# Patient Record
Sex: Male | Born: 1985 | Marital: Single | Smoking: Current every day smoker
Health system: Southern US, Community
[De-identification: ages and names within clinical notes are randomized; demographics above are authoritative.]

## PROBLEM LIST (undated history)

## (undated) DIAGNOSIS — J45909 Unspecified asthma, uncomplicated: Secondary | ICD-10-CM

## (undated) DIAGNOSIS — F988 Other specified behavioral and emotional disorders with onset usually occurring in childhood and adolescence: Secondary | ICD-10-CM

## (undated) DIAGNOSIS — M1990 Unspecified osteoarthritis, unspecified site: Secondary | ICD-10-CM

## (undated) DIAGNOSIS — W3400XA Accidental discharge from unspecified firearms or gun, initial encounter: Secondary | ICD-10-CM

---

## 2016-05-21 ENCOUNTER — Encounter (HOSPITAL_COMMUNITY): Payer: Self-pay

## 2016-05-21 ENCOUNTER — Emergency Department (HOSPITAL_COMMUNITY)
Admission: EM | Admit: 2016-05-21 | Discharge: 2016-05-21 | Disposition: A | Discharge status: Home / Self Care | Payer: Self-pay | Attending: Emergency Medicine | Admitting: Emergency Medicine

## 2016-05-21 DIAGNOSIS — F17210 Nicotine dependence, cigarettes, uncomplicated: Secondary | ICD-10-CM | POA: Insufficient documentation

## 2016-05-21 DIAGNOSIS — F909 Attention-deficit hyperactivity disorder, unspecified type: Secondary | ICD-10-CM | POA: Insufficient documentation

## 2016-05-21 DIAGNOSIS — M1990 Unspecified osteoarthritis, unspecified site: Secondary | ICD-10-CM | POA: Insufficient documentation

## 2016-05-21 DIAGNOSIS — F1110 Opioid abuse, uncomplicated: Secondary | ICD-10-CM | POA: Insufficient documentation

## 2016-05-21 DIAGNOSIS — J45909 Unspecified asthma, uncomplicated: Secondary | ICD-10-CM | POA: Insufficient documentation

## 2016-05-21 HISTORY — DX: Other specified behavioral and emotional disorders with onset usually occurring in childhood and adolescence: F98.8

## 2016-05-21 HISTORY — DX: Accidental discharge from unspecified firearms or gun, initial encounter: W34.00XA

## 2016-05-21 HISTORY — DX: Unspecified osteoarthritis, unspecified site: M19.90

## 2016-05-21 HISTORY — DX: Unspecified asthma, uncomplicated: J45.909

## 2016-05-21 MED ORDER — CLONIDINE HCL 0.1 MG PO TABS: ORAL_TABLET | ORAL | Status: DC

## 2016-05-21 MED ORDER — DICYCLOMINE HCL 20 MG PO TABS: 20.0000 mg | ORAL_TABLET | Freq: Two times a day (BID) | ORAL | Status: DC

## 2016-05-21 MED ORDER — ONDANSETRON 4 MG PO TBDP
4.0000 mg | ORAL_TABLET | Freq: Once | ORAL | Status: AC
  Administered 2016-05-21: 4 mg via ORAL
  Filled 2016-05-21: qty 1

## 2016-05-21 MED ORDER — CLONIDINE HCL 0.1 MG PO TABS
0.1000 mg | ORAL_TABLET | Freq: Once | ORAL | Status: AC
  Administered 2016-05-21: 0.1 mg via ORAL
  Filled 2016-05-21: qty 1

## 2016-05-21 MED ORDER — DICYCLOMINE HCL 10 MG PO CAPS
10.0000 mg | ORAL_CAPSULE | Freq: Once | ORAL | Status: AC
  Administered 2016-05-21: 10 mg via ORAL
  Filled 2016-05-21: qty 1

## 2016-05-21 MED ORDER — ONDANSETRON 4 MG PO TBDP: 4.0000 mg | ORAL_TABLET | Freq: Three times a day (TID) | ORAL | Status: AC | PRN

## 2016-05-21 NOTE — Discharge Instructions (Signed)
[employer] Guide Inpatient Behavioral Health/Residential  Substance Abuse Treatment Adults The [employer] 211 is a great source of information about community services available.  Access by dialing 2-1-1 from anywhere in [place], or by website -  PooledIncome.pl.   (Updated 11/2015)  Crisis Assistance 24 hours a day   Services Offered    [facility] [employer]  24-hour crisis assistance: [phone] [place], [place]   [facility]  24-hour crisis [phone] [place], [place]  [employer]   24-hour crisis assistance: [phone] [place], [place]   [facility] Access to Care Line  24-hour crisis assistance; [phone] All   [facility]  24-hour crisis response line: [phone] All   Other Local Resources (Updated 11/2015)  Inpatient Behavioral Health/Residential Substance Abuse Treatment Programs   Services      Address and Phone Number  [facility])   14-day residential rehabilitation  [phone] 100 [address] [place], [place]  [facility] ([facility])    Detox - private pay only  14-day residential rehabilitation -  Medicaid, insurance, private pay only [phone], or [phone] [address] [place], [place] [address]   [facility] [employer] only  Multiple facilities [phone] admissions   [facility] ([facility])   90-day program  Must be homeless to participate  [phone], or [phone] Marcy Panning, [facility]  [facility] only [phone], or  [phone] [address] [place], [place] [address]  [facility]     Must make an appointment  Transportation is offered from [place] on [employer].  Accepts private pay, Sheryn Bison [facility] [phone]  [address] [place], [place] [address]   [employer]  Females only  Associated with the [facility] [phone] [address] [place], [place] [address]  [facility] only [phone], or [phone] [address] [place], [place]  [facility]    Detox  Residential rehabilitation  Private insurance only  Multiple locations [phone] admissions  [facility]    Private pay  Private insurance [phone] [address] [place], [place] [address]  [facility]    Males only  Fee required at time of admission [phone] [address] [place], [place] [address]  [facility]    Private pay only  [phone] [address] [place], [place]  [facility])    Detox - private pay, Medicaid  Residential rehabilitation for males  - Medicare, Medicaid, insurance, private pay [phone] [address] [place], [place]   [facility]    Walk-in interviews Monday - Saturday from 8 am - 4 pm  Individuals with legal charges are not eligible [phone] [address] [place], [place] [address]  [facility]   Must be willing to work  Must attend Alcoholics Anonymous meetings [phone] [address] [place], [place]   [facility] [employer]    Faith-based program  Private pay only [phone] [address] [place], [place]   Opioid Use Disorder Opioid use disorder is a mental disorder. It is the continued nonmedical use of opioids in spite of risks to health and well-being. Misused opioids include the street drug heroin. They also include pain medicines such as morphine, hydrocodone, oxycodone, and fentanyl. Opioids are very addictive. People who misuse opioids get an exaggerated feeling of well-being. Opioid use disorder often disrupts activities at home, work,  or school. It may cause mental or physical problems.  A family history of opioid use  disorder puts you at higher risk of it. People with opioid use disorder often misuse other drugs or have mental illness such as depression, posttraumatic stress disorder, or antisocial personality disorder. They also are at risk of suicide and death from overdose. SIGNS AND SYMPTOMS  Signs and symptoms of opioid use disorder include:  Use of opioids in larger amounts or over a longer period than intended.  Unsuccessful attempts to cut down or control opioid use.  A lot of time spent obtaining, using, or recovering from the effects of opioids.  A strong desire or urge to use opioids (craving).  Continued use of opioids in spite of major problems at work, school, or home because of use.  Continued use of opioids in spite of relationship problems because of use.  Giving up or cutting down on important life activities because of opioid use.  Use of opioids over and over in situations when it is physically hazardous, such as driving a car.  Continued use of opioids in spite of a physical problem that is likely related to use. Physical problems can include:  Severe constipation.  Poor nutrition.  Infertility.  Tuberculosis.  Aspiration pneumonia.  Infections such as human immunodeficiency virus (HIV) and hepatitis (from injecting opioids).  Continued use of opioids in spite of a mental problem that is likely related to use. Mental problems can include:  Depression.  Anxiety.  Hallucinations.  Sleep problems.  Loss of sexual function.  Need to use more and more opioids to get the same effect, or lessened effect over time with use of the same amount (tolerance).  Having withdrawal symptoms when opioid use is stopped, or using opioids to reduce or avoid withdrawal symptoms. Withdrawal symptoms include:  Depressed, anxious, or irritable mood.  Nausea, vomiting, diarrhea, or intestinal cramping.  Muscle aches or spasms.  Excessive tearing or runny nose.  Dilated pupils,  sweating, or hairs standing on end.  Yawning.  Fever, raised blood pressure, or fast pulse.  Restlessness or trouble sleeping. This does not apply to people taking opioids for medical reasons only. DIAGNOSIS Opioid use disorder is diagnosed by your health care provider. You may be asked questions about your opioid use and and how it affects your life. A physical exam may be done. A drug screen may be ordered. You may be referred to a mental health professional. The diagnosis of opioid use disorder requires at least two symptoms within 12 months. The type of opioid use disorder you have depends on the number of signs and symptoms you have. The type may be:  Mild. Two or three signs and symptoms.   Moderate. Four or five signs and symptoms.   Severe. Six or more signs and symptoms. TREATMENT  Treatment is usually provided by mental health professionals with training in substance use disorders.The following options are available:  Detoxification.This is the first step in treatment for withdrawal. It is medically supervised withdrawal with the use of medicines. These medicines lessen withdrawal symptoms. They also raise the chance of becoming opioid free.  Counseling, also known as talk therapy. Talk therapy addresses the reasons you use opioids. It also addresses ways to keep you from using again (relapse). The goals of talk therapy are to avoid relapse by:  Identifying and avoiding triggers for use.  Finding healthy ways to cope with stress.  Learning how to handle cravings.  Support groups. Support groups provide  emotional support, advice, and guidance.  A medicine that blocks opioid receptors in your brain. This medicine can reduce opioid cravings that lead to relapse. This medicine also blocks the desired opioid effect when relapse occurs.  Opioids that are taken by mouth in place of the misused opioid (opioid maintenance treatment). These medicines satisfy cravings but are  safer than commonly misused opioids. This often is the best option for people who continue to relapse with other treatments. HOME CARE INSTRUCTIONS   Take medicines only as directed by your health care provider.  Check with your health care provider before starting new medicines.  Keep all follow-up visits as directed by your health care provider. SEEK MEDICAL CARE IF:  You are not able to take your medicines as directed.  Your symptoms get worse. SEEK IMMEDIATE MEDICAL CARE IF:  You have serious thoughts about hurting yourself or others.  You may have taken an overdose of opioids. FOR MORE INFORMATION  National Institute on Drug Abuse: http://www.price-smith.com/www.drugabuse.gov  Substance Abuse and Mental Health Services Administration: SkateOasis.com.ptwww.samhsa.gov   This information is not intended to replace advice given to you by your health care provider. Make sure you discuss any questions you have with your health care provider.   Document Released: 08/20/2007 Document Revised: 11/13/2014 Document Reviewed: 11/05/2013 Elsevier Interactive Patient Education [employer]2016 Elsevier Inc.

## 2016-05-21 NOTE — ED Notes (Addendum)
Pt presents wanting [facility] detox.  Last use was yesterday.  Denies SI/HI/AVH.  Pt reports abusing heroin x 2 years.  Sts he detoxed that home, but relapsed x 2 months ago.      Pt reports that he used to take Concerta, Albuterol, and propranolol.  Sts he hasn't taken it in "months."  [place].
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
## 2021-05-29 ENCOUNTER — Emergency Department (HOSPITAL_BASED_OUTPATIENT_CLINIC_OR_DEPARTMENT_OTHER): Payer: BLUE CROSS/BLUE SHIELD

## 2021-05-29 ENCOUNTER — Other Ambulatory Visit: Payer: Self-pay

## 2021-05-29 ENCOUNTER — Inpatient Hospital Stay (HOSPITAL_BASED_OUTPATIENT_CLINIC_OR_DEPARTMENT_OTHER)
Admission: EM | Admit: 2021-05-29 | Discharge: 2021-06-04 | DRG: 064 | Disposition: A | Discharge status: Home / Self Care | Payer: BLUE CROSS/BLUE SHIELD | Attending: Internal Medicine | Admitting: Internal Medicine

## 2021-05-29 DIAGNOSIS — F17210 Nicotine dependence, cigarettes, uncomplicated: Secondary | ICD-10-CM | POA: Diagnosis not present

## 2021-05-29 DIAGNOSIS — T1490XA Injury, unspecified, initial encounter: Secondary | ICD-10-CM

## 2021-05-29 DIAGNOSIS — I10 Essential (primary) hypertension: Secondary | ICD-10-CM | POA: Diagnosis present

## 2021-05-29 DIAGNOSIS — I619 Nontraumatic intracerebral hemorrhage, unspecified: Secondary | ICD-10-CM

## 2021-05-29 DIAGNOSIS — J45909 Unspecified asthma, uncomplicated: Secondary | ICD-10-CM | POA: Diagnosis present

## 2021-05-29 DIAGNOSIS — G8191 Hemiplegia, unspecified affecting right dominant side: Secondary | ICD-10-CM | POA: Diagnosis present

## 2021-05-29 DIAGNOSIS — K700 Alcoholic fatty liver: Secondary | ICD-10-CM

## 2021-05-29 DIAGNOSIS — Z888 Allergy status to other drugs, medicaments and biological substances status: Secondary | ICD-10-CM

## 2021-05-29 DIAGNOSIS — R7401 Elevation of levels of liver transaminase levels: Secondary | ICD-10-CM

## 2021-05-29 DIAGNOSIS — M545 Low back pain, unspecified: Secondary | ICD-10-CM | POA: Diagnosis present

## 2021-05-29 DIAGNOSIS — F1410 Cocaine abuse, uncomplicated: Secondary | ICD-10-CM | POA: Diagnosis not present

## 2021-05-29 DIAGNOSIS — F1910 Other psychoactive substance abuse, uncomplicated: Secondary | ICD-10-CM

## 2021-05-29 DIAGNOSIS — N179 Acute kidney failure, unspecified: Secondary | ICD-10-CM

## 2021-05-29 DIAGNOSIS — F1010 Alcohol abuse, uncomplicated: Secondary | ICD-10-CM | POA: Diagnosis not present

## 2021-05-29 DIAGNOSIS — R29810 Facial weakness: Secondary | ICD-10-CM | POA: Diagnosis not present

## 2021-05-29 DIAGNOSIS — R29704 NIHSS score 4: Secondary | ICD-10-CM | POA: Diagnosis present

## 2021-05-29 DIAGNOSIS — G9341 Metabolic encephalopathy: Secondary | ICD-10-CM | POA: Diagnosis not present

## 2021-05-29 DIAGNOSIS — B1910 Unspecified viral hepatitis B without hepatic coma: Secondary | ICD-10-CM | POA: Diagnosis present

## 2021-05-29 DIAGNOSIS — R7989 Other specified abnormal findings of blood chemistry: Secondary | ICD-10-CM | POA: Diagnosis present

## 2021-05-29 DIAGNOSIS — Z79899 Other long term (current) drug therapy: Secondary | ICD-10-CM | POA: Diagnosis not present

## 2021-05-29 DIAGNOSIS — K219 Gastro-esophageal reflux disease without esophagitis: Secondary | ICD-10-CM | POA: Diagnosis not present

## 2021-05-29 DIAGNOSIS — Z20822 Contact with and (suspected) exposure to covid-19: Secondary | ICD-10-CM | POA: Diagnosis not present

## 2021-05-29 DIAGNOSIS — J690 Pneumonitis due to inhalation of food and vomit: Secondary | ICD-10-CM

## 2021-05-29 DIAGNOSIS — I63412 Cerebral infarction due to embolism of left middle cerebral artery: Secondary | ICD-10-CM | POA: Diagnosis not present

## 2021-05-29 DIAGNOSIS — I6330 Cerebral infarction due to thrombosis of unspecified cerebral artery: Secondary | ICD-10-CM

## 2021-05-29 DIAGNOSIS — R778 Other specified abnormalities of plasma proteins: Secondary | ICD-10-CM | POA: Diagnosis not present

## 2021-05-29 DIAGNOSIS — R748 Abnormal levels of other serum enzymes: Secondary | ICD-10-CM

## 2021-05-29 DIAGNOSIS — I330 Acute and subacute infective endocarditis: Secondary | ICD-10-CM | POA: Diagnosis not present

## 2021-05-29 DIAGNOSIS — E785 Hyperlipidemia, unspecified: Secondary | ICD-10-CM | POA: Diagnosis present

## 2021-05-29 DIAGNOSIS — G9340 Encephalopathy, unspecified: Secondary | ICD-10-CM

## 2021-05-29 DIAGNOSIS — I059 Rheumatic mitral valve disease, unspecified: Secondary | ICD-10-CM

## 2021-05-29 DIAGNOSIS — R4182 Altered mental status, unspecified: Secondary | ICD-10-CM

## 2021-05-29 LAB — I-STAT VENOUS BLOOD GAS, ED
Acid-Base Excess: 2 mmol/L (ref 0.0–2.0)
Bicarbonate: 28.5 mmol/L — ABNORMAL HIGH (ref 20.0–28.0)
Calcium, Ion: 0.96 mmol/L — ABNORMAL LOW (ref 1.15–1.40)
HCT: 55 % — ABNORMAL HIGH (ref 39.0–52.0)
Hemoglobin: 18.7 g/dL — ABNORMAL HIGH (ref 13.0–17.0)
O2 Saturation: 66 %
Potassium: 5.3 mmol/L — ABNORMAL HIGH (ref 3.5–5.1)
Sodium: 132 mmol/L — ABNORMAL LOW (ref 135–145)
TCO2: 30 mmol/L (ref 22–32)
pCO2, Ven: 47.8 mmHg (ref 44.0–60.0)
pH, Ven: 7.383 (ref 7.250–7.430)
pO2, Ven: 35 mmHg (ref 32.0–45.0)

## 2021-05-29 LAB — SALICYLATE LEVEL: Salicylate Lvl: <7 mg/dL — ABNORMAL LOW (ref 7.0–30.0)

## 2021-05-29 LAB — AMMONIA: Ammonia: 30 umol/L (ref 9–35)

## 2021-05-29 LAB — CBC WITH DIFFERENTIAL/PLATELET
Abs Immature Granulocytes: 0.06 10*3/uL (ref 0.00–0.07)
Basophils Absolute: 0 10*3/uL (ref 0.0–0.1)
Basophils Relative: 0 %
Eosinophils Absolute: 0 10*3/uL (ref 0.0–0.5)
Eosinophils Relative: 0 %
HCT: 43.9 % (ref 39.0–52.0)
Hemoglobin: 15.4 g/dL (ref 13.0–17.0)
Immature Granulocytes: 1 %
Lymphocytes Relative: 7 %
Lymphs Abs: 0.6 10*3/uL — ABNORMAL LOW (ref 0.7–4.0)
MCH: 30 pg (ref 26.0–34.0)
MCHC: 35.1 g/dL (ref 30.0–36.0)
MCV: 85.4 fL (ref 80.0–100.0)
Monocytes Absolute: 0.8 10*3/uL (ref 0.1–1.0)
Monocytes Relative: 8 %
Neutro Abs: 7.9 10*3/uL — ABNORMAL HIGH (ref 1.7–7.7)
Neutrophils Relative %: 84 %
Platelets: 225 10*3/uL (ref 150–400)
RBC: 5.14 MIL/uL (ref 4.22–5.81)
RDW: 12.2 % (ref 11.5–15.5)
WBC: 9.4 10*3/uL (ref 4.0–10.5)
nRBC: 0.3 % — ABNORMAL HIGH (ref 0.0–0.2)

## 2021-05-29 LAB — COMPREHENSIVE METABOLIC PANEL
ALT: 728 U/L — ABNORMAL HIGH (ref 0–44)
AST: 1503 U/L — ABNORMAL HIGH (ref 15–41)
Albumin: 4.3 g/dL (ref 3.5–5.0)
Alkaline Phosphatase: 62 U/L (ref 38–126)
Anion gap: 12 (ref 5–15)
BUN: 28 mg/dL — ABNORMAL HIGH (ref 6–20)
CO2: 28 mmol/L (ref 22–32)
Calcium: 7.8 mg/dL — ABNORMAL LOW (ref 8.9–10.3)
Chloride: 91 mmol/L — ABNORMAL LOW (ref 98–111)
Creatinine, Ser: 2.1 mg/dL — ABNORMAL HIGH (ref 0.61–1.24)
GFR, Estimated: 42 mL/min — ABNORMAL LOW (ref >60)
Glucose, Bld: 172 mg/dL — ABNORMAL HIGH (ref 70–99)
Potassium: 4.9 mmol/L (ref 3.5–5.1)
Sodium: 131 mmol/L — ABNORMAL LOW (ref 135–145)
Total Bilirubin: 1.4 mg/dL — ABNORMAL HIGH (ref 0.3–1.2)
Total Protein: 7.6 g/dL (ref 6.5–8.1)

## 2021-05-29 LAB — RAPID URINE DRUG SCREEN, HOSP PERFORMED
Amphetamines: NOT DETECTED
Barbiturates: NOT DETECTED
Benzodiazepines: NOT DETECTED
Cocaine: POSITIVE — AB
Opiates: NOT DETECTED
Tetrahydrocannabinol: POSITIVE — AB

## 2021-05-29 LAB — URINALYSIS, ROUTINE W REFLEX MICROSCOPIC
Glucose, UA: NEGATIVE mg/dL
Ketones, ur: NEGATIVE mg/dL
Leukocytes,Ua: NEGATIVE
Nitrite: POSITIVE — AB
Protein, ur: 100 mg/dL — AB
Specific Gravity, Urine: 1.02 (ref 1.005–1.030)
pH: 6 (ref 5.0–8.0)

## 2021-05-29 LAB — APTT
aPTT: 29 seconds (ref 24–36)
aPTT: 30 seconds (ref 24–36)

## 2021-05-29 LAB — PROTIME-INR
INR: 1 (ref 0.8–1.2)
INR: 1 (ref 0.8–1.2)
Prothrombin Time: 13.5 seconds (ref 11.4–15.2)
Prothrombin Time: 12.9 seconds (ref 11.4–15.2)

## 2021-05-29 LAB — RESP PANEL BY RT-PCR (FLU A&B, COVID) ARPGX2
Influenza A by PCR: NEGATIVE
Influenza B by PCR: NEGATIVE
SARS Coronavirus 2 by RT PCR: NEGATIVE

## 2021-05-29 LAB — HEPATIC FUNCTION PANEL
ALT: 731 U/L — ABNORMAL HIGH (ref 0–44)
AST: 1462 U/L — ABNORMAL HIGH (ref 15–41)
Albumin: 4.4 g/dL (ref 3.5–5.0)
Alkaline Phosphatase: 63 U/L (ref 38–126)
Bilirubin, Direct: 0.4 mg/dL — ABNORMAL HIGH (ref 0.0–0.2)
Indirect Bilirubin: 1.2 mg/dL — ABNORMAL HIGH (ref 0.3–0.9)
Total Bilirubin: 1.6 mg/dL — ABNORMAL HIGH (ref 0.3–1.2)
Total Protein: 7.7 g/dL (ref 6.5–8.1)

## 2021-05-29 LAB — ACETAMINOPHEN LEVEL: Acetaminophen (Tylenol), Serum: <10 ug/mL — ABNORMAL LOW (ref 10–30)

## 2021-05-29 LAB — I-STAT CHEM 8, ED
BUN: 28 mg/dL — ABNORMAL HIGH (ref 6–20)
Calcium, Ion: 0.97 mmol/L — ABNORMAL LOW (ref 1.15–1.40)
Chloride: 94 mmol/L — ABNORMAL LOW (ref 98–111)
Creatinine, Ser: 2.2 mg/dL — ABNORMAL HIGH (ref 0.61–1.24)
Glucose, Bld: 164 mg/dL — ABNORMAL HIGH (ref 70–99)
HCT: 54 % — ABNORMAL HIGH (ref 39.0–52.0)
Hemoglobin: 18.4 g/dL — ABNORMAL HIGH (ref 13.0–17.0)
Potassium: 5.2 mmol/L — ABNORMAL HIGH (ref 3.5–5.1)
Sodium: 132 mmol/L — ABNORMAL LOW (ref 135–145)
TCO2: 26 mmol/L (ref 22–32)

## 2021-05-29 LAB — URINALYSIS, MICROSCOPIC (REFLEX)

## 2021-05-29 LAB — LACTIC ACID, PLASMA
Lactic Acid, Venous: 2.9 mmol/L (ref 0.5–1.9)
Lactic Acid, Venous: 2 mmol/L (ref 0.5–1.9)

## 2021-05-29 LAB — LIPASE, BLOOD: Lipase: 27 U/L (ref 11–51)

## 2021-05-29 LAB — ETHANOL: Alcohol, Ethyl (B): <10 mg/dL (ref <10)
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
## 2021-05-29 IMAGING — CT CT CHEST-ABD-PELV W/ CM
3 of 5 series · 15 of 36 positions shown, 17 images · IV contrast (Omnipaque)
Comparison: None.

CLINICAL DATA: Altered mental status

EXAM:
CT CHEST, ABDOMEN, AND PELVIS WITH CONTRAST
TECHNIQUE: Multidetector CT imaging of the chest, abdomen and pelvis was
performed following the standard protocol during bolus
administration of intravenous contrast.
CONTRAST:  80mL OMNIPAQUE IOHEXOL 300 MG/ML  SOLN

[Series 2: cap with 2 · axial · 0.87mm/px · z∈[-855,-300]mm · 10 of 137 slices shown, 12 images]
[im 13/137  mediastinal]
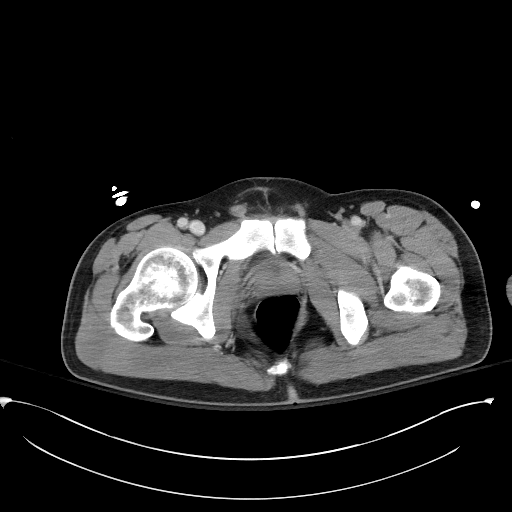
[im 13/137  bone]
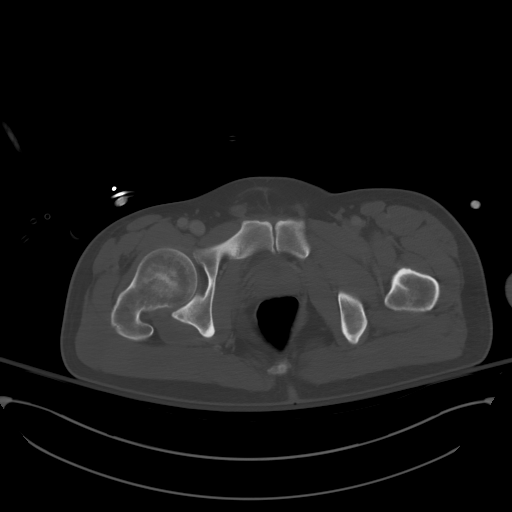
[im 25/137  mediastinal]
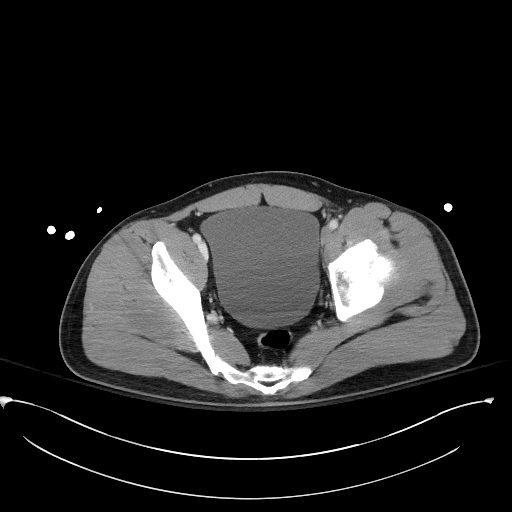
[im 38/137  mediastinal]
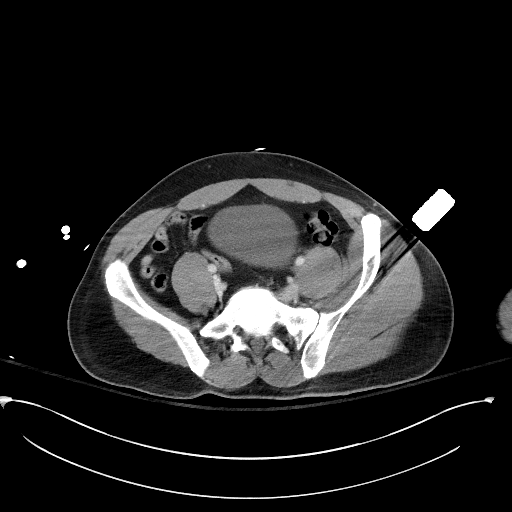
[im 50/137  mediastinal]
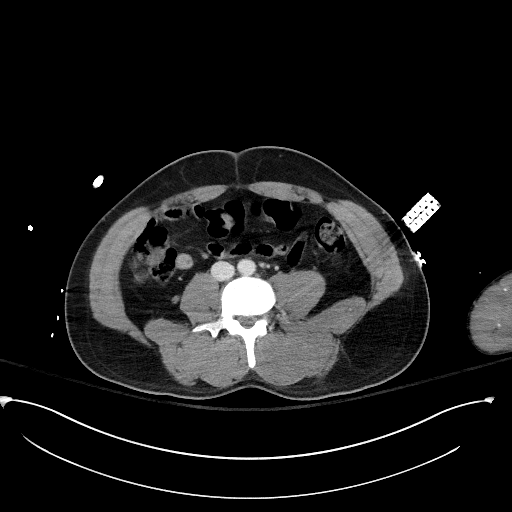
[im 62/137  mediastinal]
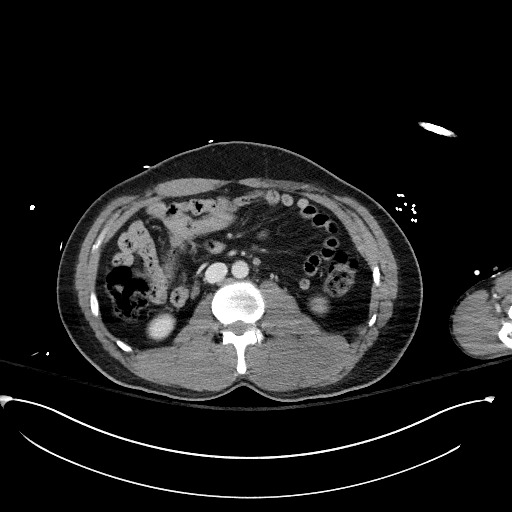
[im 75/137  mediastinal]
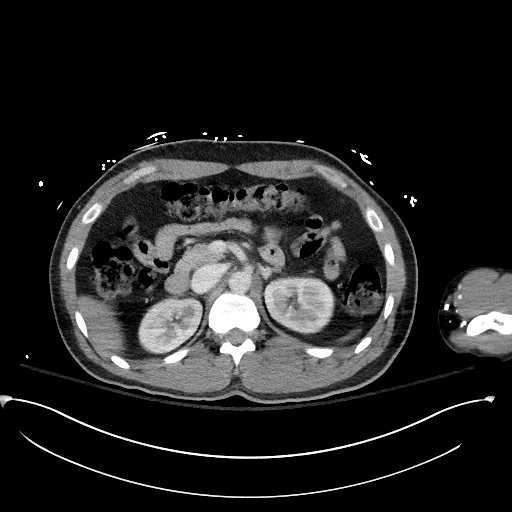
[im 87/137  mediastinal]
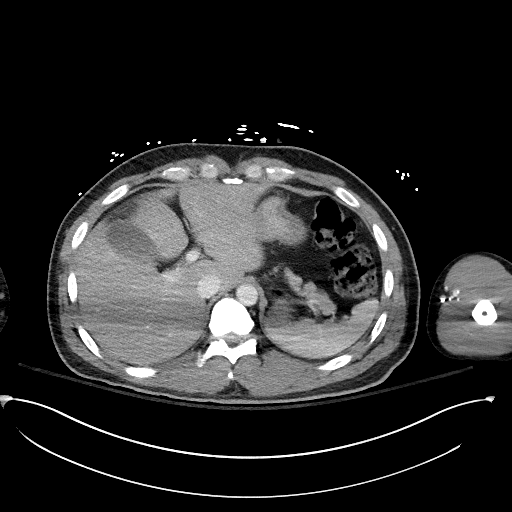
[im 99/137  mediastinal]
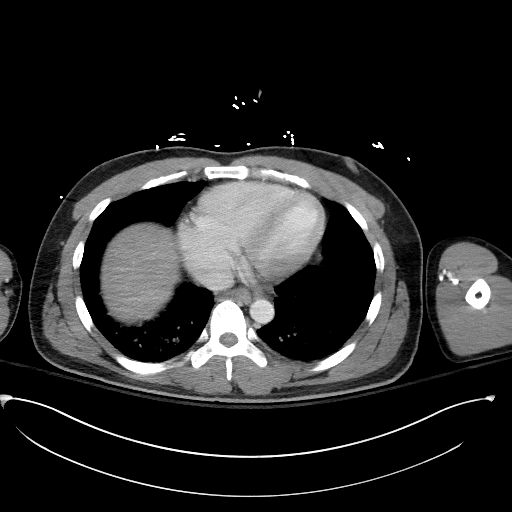
[im 112/137  mediastinal]
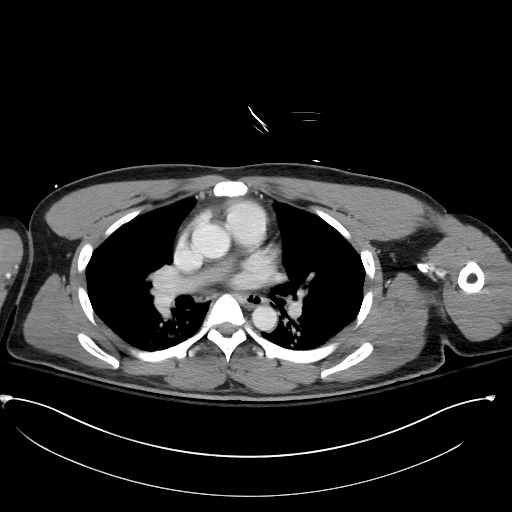
[im 112/137  bone]
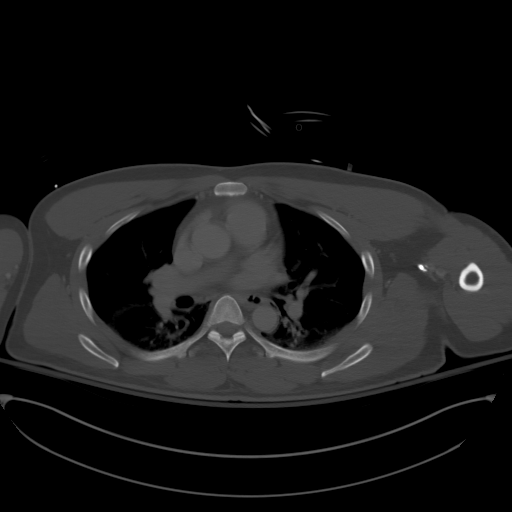
[im 124/137  mediastinal]
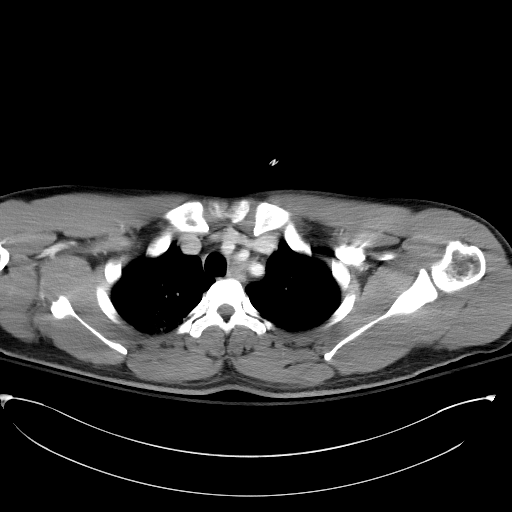

[Series 4: lung · axial · 0.87mm/px · z∈[-481,-437]mm · 2 of 135 slices shown]
[im 12/135  bone]
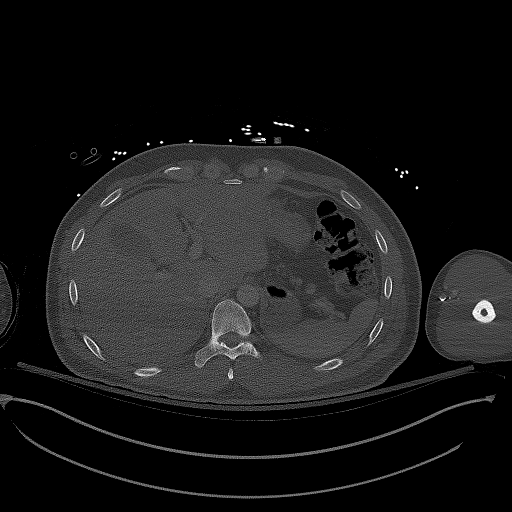
[im 34/135  bone]
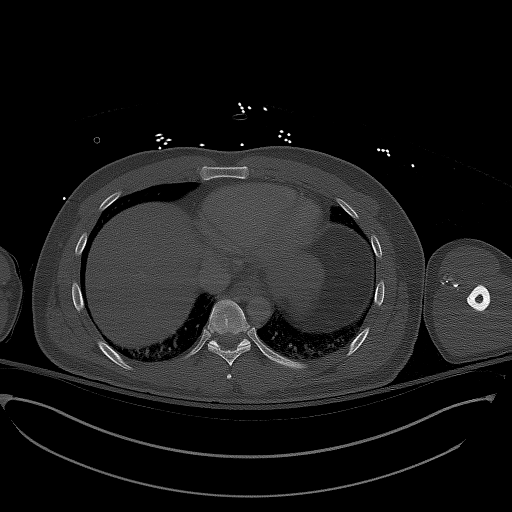

[Series 6: coronals · coronal · 1.33mm/px · 3 of 128 slices shown]
[im 26/128  mediastinal]
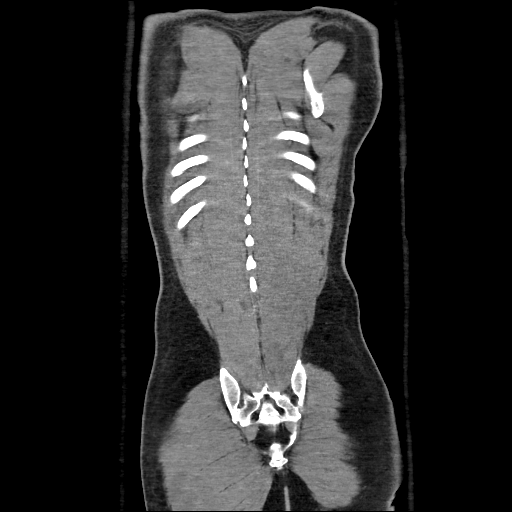
[im 51/128  mediastinal]
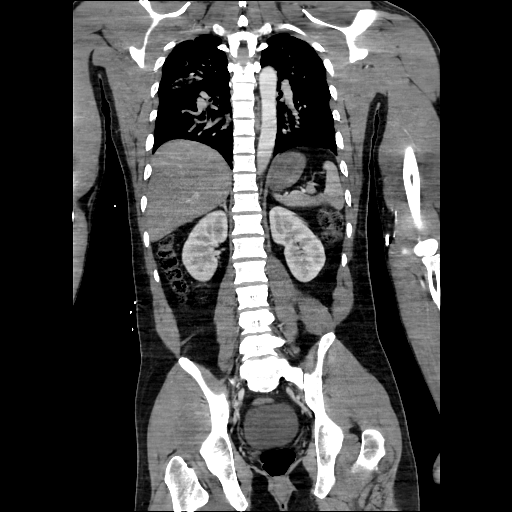
[im 77/128  mediastinal]
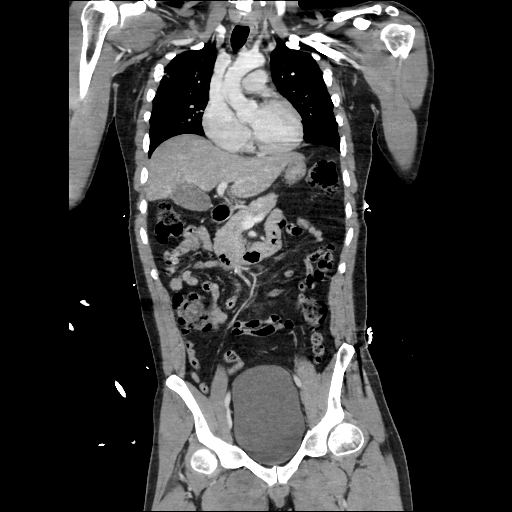

[15 of 36 positions shown; findings below may reference images not displayed]

FINDINGS: CT CHEST FINDINGS

Cardiovascular: The aortic root is suboptimally assessed given
cardiac pulsation artifact. The aorta is normal caliber. No acute
luminal abnormality of the imaged aorta. No periaortic stranding or
hemorrhage. Normal 3 vessel branching of the aortic arch. Proximal
great vessels are unremarkable. Normal heart size. No pericardial
effusion. Central pulmonary arteries are normal caliber. No large
central filling defects. More distal evaluation limited by a non
tailored technique. No major venous abnormalities.

Mediastinum/Nodes: No mediastinal fluid or gas. Normal thyroid gland
and thoracic inlet. No acute abnormality of the trachea. Mild
thickening of the distal thoracic esophagus. No worrisome
mediastinal, hilar or axillary adenopathy.

Lungs/Pleura: Patchy areas of mixed ground-glass and consolidative
opacity are present in the right upper lobe and minimally in the
bilateral lower lobes with additional hypoventilatory
changes/atelectasis. No pneumothorax. No layering effusion. No
concerning pulmonary nodules or masses.

Musculoskeletal: No acute osseous abnormality or suspicious osseous
lesion. No body wall hematoma, chest wall mass or other concerning
abnormality.

CT ABDOMEN PELVIS FINDINGS

Hepatobiliary: No direct hepatic injury or perihepatic hematoma.
Diffuse hepatic hypoattenuation compatible with hepatic steatosis.
Sparing along the gallbladder fossa. No concerning focal liver
lesion. Smooth liver surface contour.

Pancreas: No pancreatic ductal dilatation or surrounding
inflammatory changes.

Spleen: Normal in size. No concerning splenic lesions. Small
accessory splenule anteriorly.

Adrenals/Urinary Tract: No concerning adrenal mass or hemorrhage.
Kidneys are normally located with symmetric enhancementand excretion
without extravasation of contrast on the excretory delayed phase
imaging. No direct renal injury or perinephric hemorrhage. No
suspicious renal lesion, urolithiasis or hydronephrosis. No evidence
of acute bladder injury or other bladder abnormality.

Stomach/Bowel: Distal esophagus, stomach and duodenal sweep are
unremarkable. No small bowel wall thickening or dilatation. No
evidence of obstruction. A normal appendix is visualized. No colonic
dilatation or wall thickening. No mesenteric hematoma or contusion.

Vascular/Lymphatic: No significant vascular findings are present. No
enlarged abdominal or pelvic lymph nodes.

Reproductive: The prostate and seminal vesicles are unremarkable. No
acute or worrisome abnormality of the external genitalia.

Other: No body wall or retroperitoneal hematoma. No free
abdominopelvic air or fluid. No traumatic abdominal wall dehiscence.
No bowel containing hernia.

Musculoskeletal: No acute osseous abnormality or suspicious osseous
lesion. Thirteen pairs of ribs, the lowest of which appear
rudimentary. Four normally formed lumbar vertebrae with a fifth
transitional, partially sacralized vertebrae.
IMPRESSION: No acute traumatic findings in the chest, abdomen or pelvis.

Patchy areas of consolidation and ground-glass seen in the upper
lobes and bilateral lower lobes, could reflect an acute infectious
or inflammatory process or possible sequela of aspiration in the
setting of altered mental status.

Mild circumferential thickening of the distal thoracic esophagus,
can be seen in the setting of emesis or reflux. Correlate with
clinical findings.

Hepatic steatosis.
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
## 2021-05-29 IMAGING — CT CT HEAD W/O CM
3 series · 15 of 47 positions shown, 18 images · non-contrast
Comparison: None.

CLINICAL DATA: Altered mental status.

EXAM:
CT HEAD WITHOUT CONTRAST
TECHNIQUE: Contiguous axial images were obtained from the base of the skull
through the vertex without intravenous contrast.

[Series 2: head wo · axial · 0.48mm/px · z∈[-134,+6]mm · 9 of 34 slices shown, 12 images]
[im 3/34  brain]
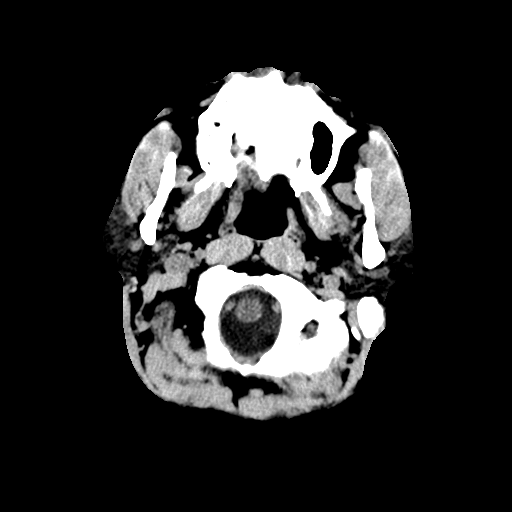
[im 3/34  bone]
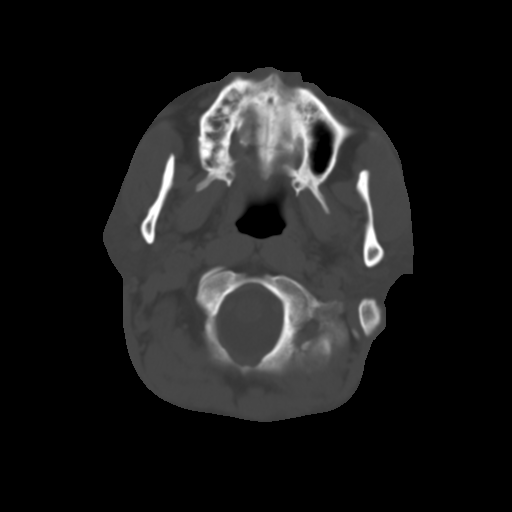
[im 6/34  brain]
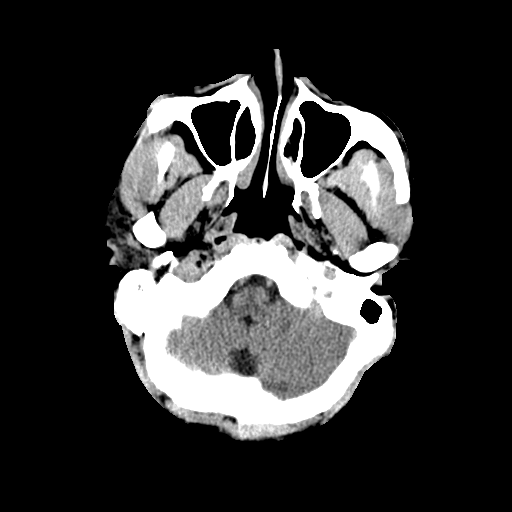
[im 10/34  brain]
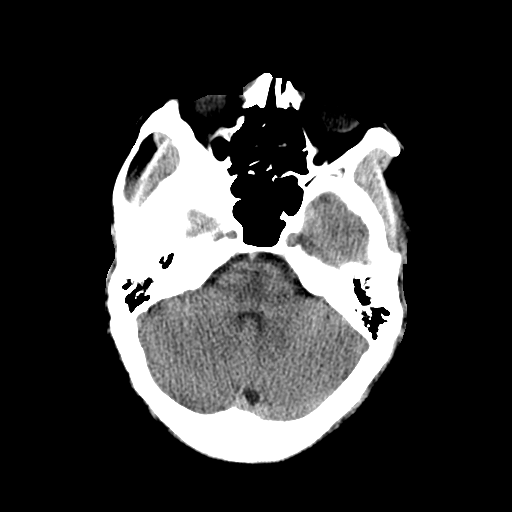
[im 13/34  brain]
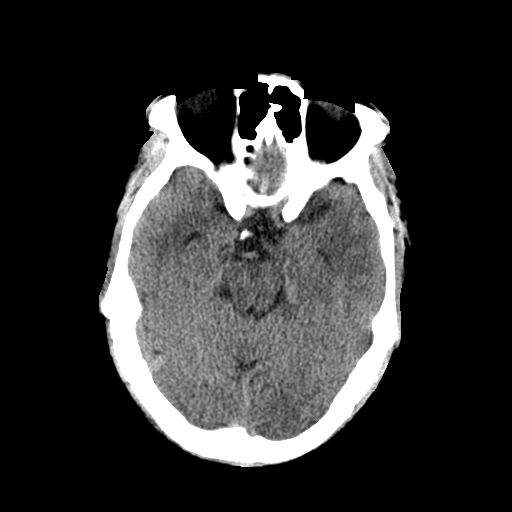
[im 18/34  brain]
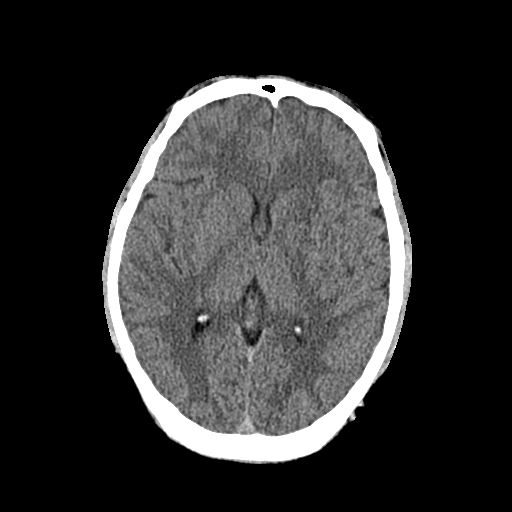
[im 18/34  bone]
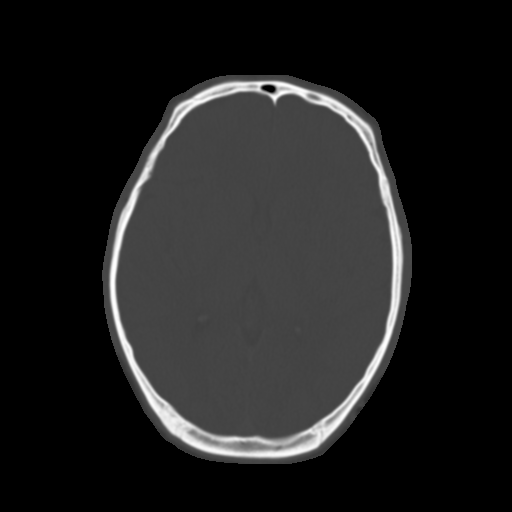
[im 21/34  brain]
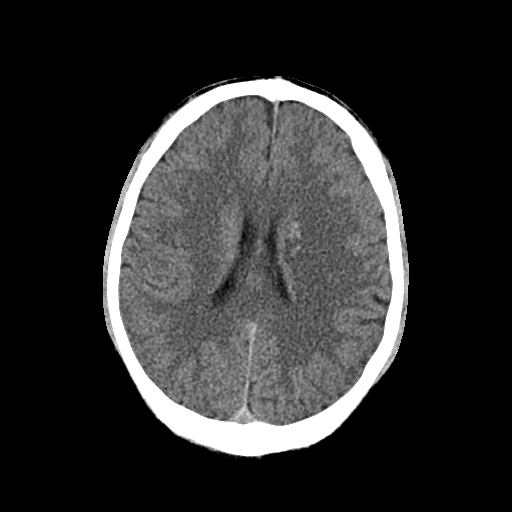
[im 24/34  brain]
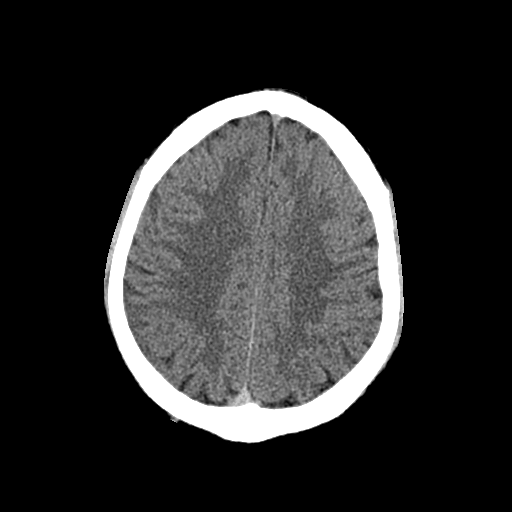
[im 28/34  brain]
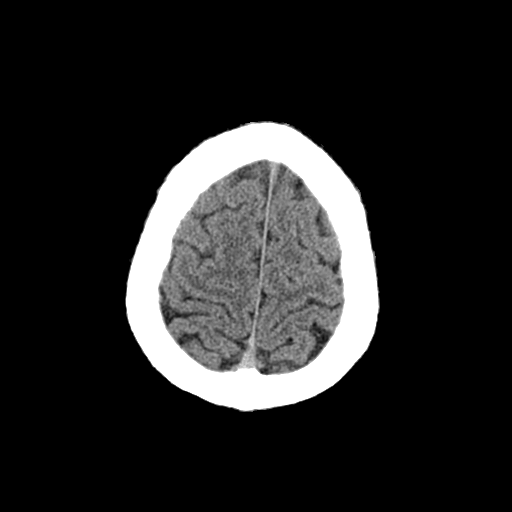
[im 31/34  brain]
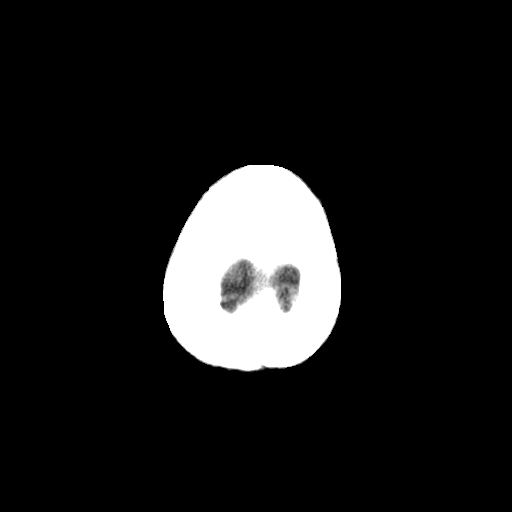
[im 31/34  bone]
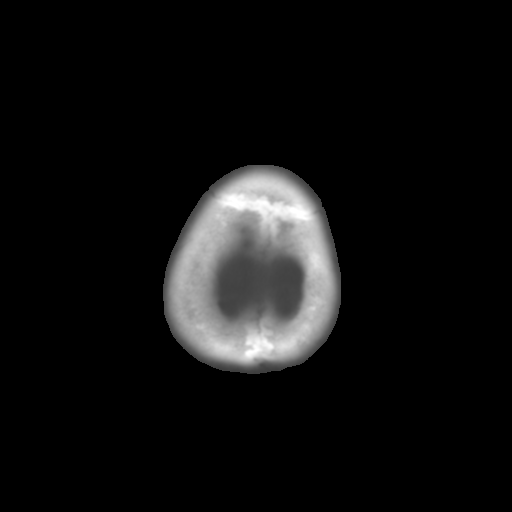

[Series 4: head coronal · coronal · 0.37mm/px · 3 of 73 slices shown]
[im 25/73  brain]
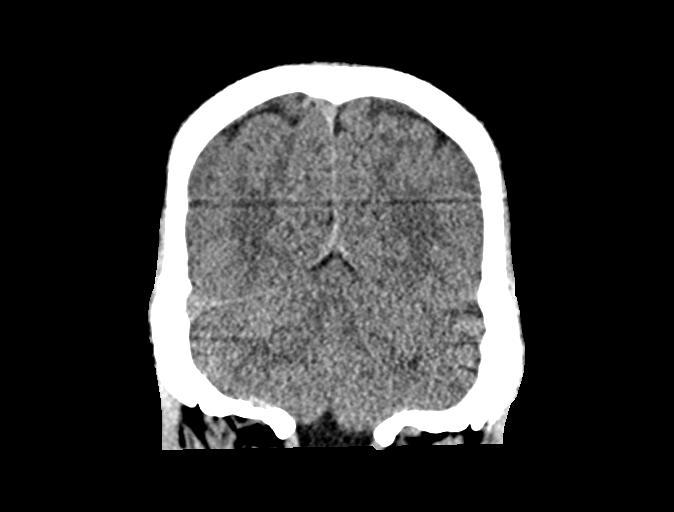
[im 33/73  brain]
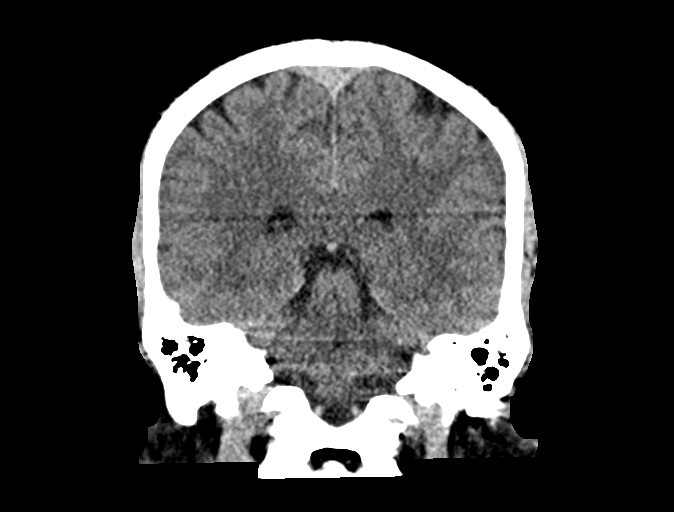
[im 41/73  brain]
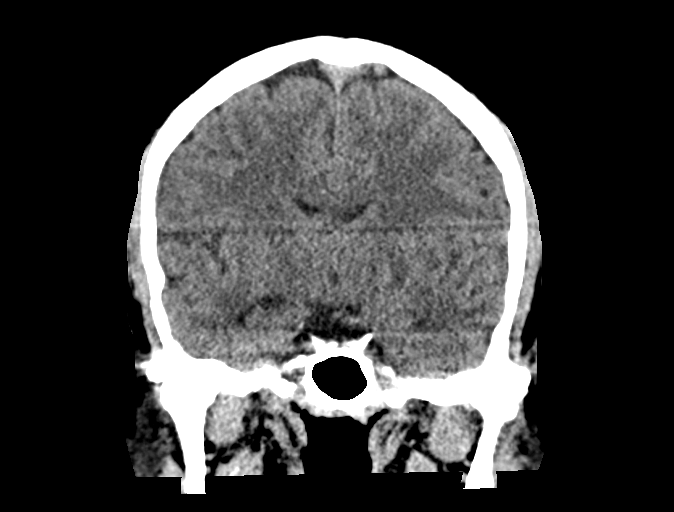

[Series 5: head sagittal · sagittal · 0.36mm/px · 3 of 63 slices shown]
[im 21/63  brain]
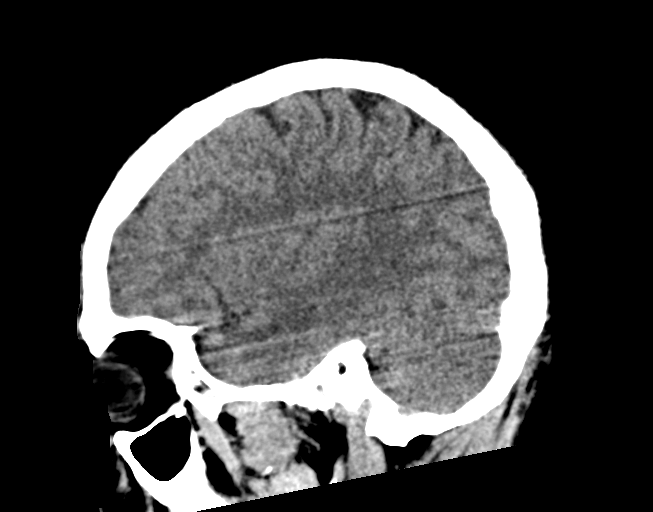
[im 32/63  brain]
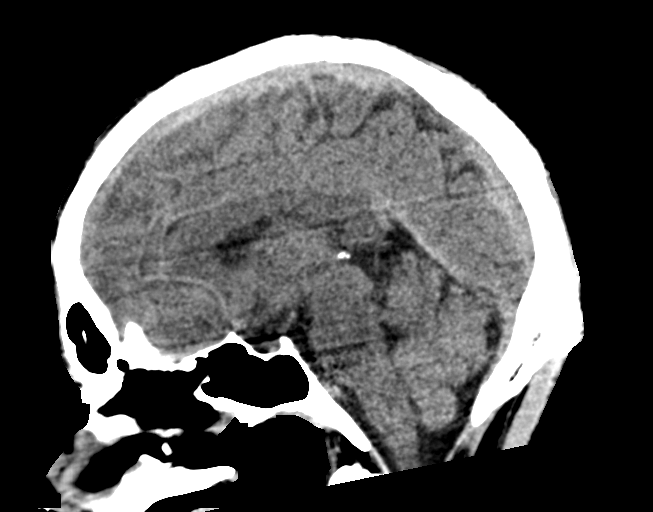
[im 42/63  brain]
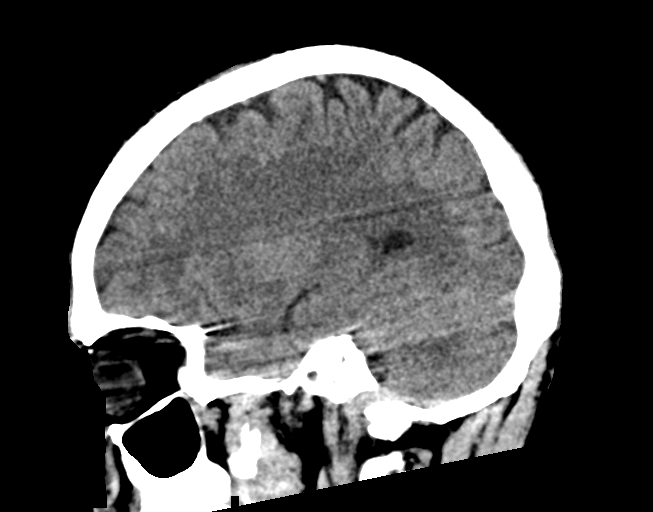

[15 of 47 positions shown; findings below may reference images not displayed]

FINDINGS: Brain: A cluster of 4 mm and 5 mm hyperdense foci are seen within
the white matter of the left parietal lobe. These are near the
anterior aspect of the left external capsule and adjacent to the
head of the caudate nucleus on the left (axial CT images 19 through
21, CT series 2). A very small amount of surrounding white matter
low attenuation is seen without evidence of significant mass effect
or midline shift.

No evidence of acute infarction, hydrocephalus, extra-axial
collection or mass lesion/mass effect.

Vascular: No hyperdense vessel or unexpected calcification.

Skull: Normal. Negative for fracture or focal lesion.

Sinuses/Orbits: No acute finding.

Other: None.
IMPRESSION: 1. Cluster of very small areas of focal hemorrhage within the white
matter of the left parietal lobe, as described above. MRI
correlation is recommended.

## 2021-05-29 MED ORDER — DEXAMETHASONE SODIUM PHOSPHATE 10 MG/ML IJ SOLN
10.0000 mg | Freq: Once | INTRAMUSCULAR | Status: AC
  Administered 2021-05-29: 10 mg via INTRAVENOUS
  Filled 2021-05-29: qty 1

## 2021-05-29 MED ORDER — VANCOMYCIN HCL IN DEXTROSE 1-5 GM/200ML-% IV SOLN
1000.0000 mg | Freq: Once | INTRAVENOUS | Status: AC
  Administered 2021-05-29: 1000 mg via INTRAVENOUS
  Filled 2021-05-29: qty 200

## 2021-05-29 MED ORDER — SODIUM CHLORIDE 0.9 % IV BOLUS
1000.0000 mL | Freq: Once | INTRAVENOUS | Status: AC
  Administered 2021-05-29: 1000 mL via INTRAVENOUS

## 2021-05-29 MED ORDER — SODIUM CHLORIDE 0.9 % IV SOLN
2.0000 g | Freq: Once | INTRAVENOUS | Status: AC
  Administered 2021-05-29: 2 g via INTRAVENOUS
  Filled 2021-05-29: qty 20

## 2021-05-29 MED ORDER — VANCOMYCIN HCL 500 MG/100ML IV SOLN
500.0000 mg | Freq: Once | INTRAVENOUS | Status: AC
  Administered 2021-05-30: 500 mg via INTRAVENOUS
  Filled 2021-05-29 (×2): qty 100

## 2021-05-29 MED ORDER — IOHEXOL 300 MG/ML  SOLN
100.0000 mL | Freq: Once | INTRAMUSCULAR | Status: AC | PRN
  Administered 2021-05-29: 80 mL via INTRAVENOUS

## 2021-05-29 MED ORDER — VANCOMYCIN HCL 750 MG/150ML IV SOLN
750.0000 mg | Freq: Two times a day (BID) | INTRAVENOUS | Status: DC
  Administered 2021-05-30 (×2): 750 mg via INTRAVENOUS
  Filled 2021-05-29 (×4): qty 150

## 2021-05-29 NOTE — ED Provider Notes (Signed)
Pt transferred from [facility] so he could get a MRI and for admission.  Vitals remain stable.  Pt is awake and will talk.  He has received abx already.  He is awaiting a MRI.  Pt d/w Dr. Toniann Fail (triad) for admission.    CRITICAL CARE Performed by: Jacalyn Lefevre   Total critical care time: 30 minutes  Critical care time was exclusive of separately billable procedures and treating other patients.  Critical care was necessary to treat or prevent imminent or life-threatening deterioration.  Critical care was time spent personally by me on the following activities: development of treatment plan with patient and/or surrogate as well as nursing, discussions with consultants, evaluation of patient's response to treatment, examination of patient, obtaining history from patient or surrogate, ordering and performing treatments and interventions, ordering and review of laboratory studies, ordering and review of radiographic studies, pulse oximetry and re-evaluation of patient's condition.    Jacalyn Lefevre, MD 05/29/21 2256

## 2021-05-29 NOTE — Progress Notes (Signed)
Pharmacy Antibiotic Note  Anthony Ward is a 35 y.o. male admitted on 05/29/2021 presenting with AMS, concern for meningitis.  Pharmacy has been consulted for vancomycin dosing.  Ctx per MD  Plan: Vancomycin 1500 mg IV x 1, then 750 mg IV q 12h (target vancomycin trough 15-20) Monitor renal function, LP/meningitis workup and ability to narrow Vancomycin levels as needed  Height: 5\' 9"  (175.3 cm) Weight: 81.6 kg (180 lb) IBW/kg (Calculated) : 70.7  Temp (24hrs), Avg:100.5 F (38.1 C), Min:100.5 F (38.1 C), Max:100.5 F (38.1 C)  Recent Labs  Lab 05/29/21 1835 05/29/21 1850 05/29/21 1901  WBC 9.4  --   --   CREATININE 2.10*  --  2.20*  LATICACIDVEN  --  2.9*  --     Estimated Creatinine Clearance: 47.3 mL/min (A) (by C-G formula based on SCr of 2.2 mg/dL (H)).    Not on File  05/31/21, PharmD Clinical Pharmacist ED Pharmacist Phone # [phone] 05/29/2021 9:11 PM
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
## 2021-05-29 NOTE — ED Notes (Signed)
Patient transported to MRI 

## 2021-05-29 NOTE — ED Triage Notes (Signed)
Pt arrives POV with friends. Pt with altered mental status and not speaking. Pt was given narcan by friends. Pt reports taking "2 ibuprofen."

## 2021-05-30 ENCOUNTER — Encounter (HOSPITAL_COMMUNITY): Payer: Self-pay | Admitting: Internal Medicine

## 2021-05-30 ENCOUNTER — Observation Stay (HOSPITAL_COMMUNITY): Payer: BLUE CROSS/BLUE SHIELD

## 2021-05-30 ENCOUNTER — Inpatient Hospital Stay (HOSPITAL_COMMUNITY): Payer: BLUE CROSS/BLUE SHIELD

## 2021-05-30 DIAGNOSIS — F1010 Alcohol abuse, uncomplicated: Secondary | ICD-10-CM | POA: Diagnosis present

## 2021-05-30 DIAGNOSIS — G9340 Encephalopathy, unspecified: Secondary | ICD-10-CM

## 2021-05-30 DIAGNOSIS — F17210 Nicotine dependence, cigarettes, uncomplicated: Secondary | ICD-10-CM | POA: Diagnosis present

## 2021-05-30 DIAGNOSIS — B1910 Unspecified viral hepatitis B without hepatic coma: Secondary | ICD-10-CM | POA: Diagnosis present

## 2021-05-30 DIAGNOSIS — R778 Other specified abnormalities of plasma proteins: Secondary | ICD-10-CM

## 2021-05-30 DIAGNOSIS — R7989 Other specified abnormal findings of blood chemistry: Secondary | ICD-10-CM | POA: Diagnosis present

## 2021-05-30 DIAGNOSIS — I6389 Other cerebral infarction: Secondary | ICD-10-CM

## 2021-05-30 DIAGNOSIS — I619 Nontraumatic intracerebral hemorrhage, unspecified: Secondary | ICD-10-CM | POA: Insufficient documentation

## 2021-05-30 DIAGNOSIS — K700 Alcoholic fatty liver: Secondary | ICD-10-CM | POA: Diagnosis present

## 2021-05-30 DIAGNOSIS — R29704 NIHSS score 4: Secondary | ICD-10-CM | POA: Diagnosis present

## 2021-05-30 DIAGNOSIS — J690 Pneumonitis due to inhalation of food and vomit: Secondary | ICD-10-CM | POA: Diagnosis present

## 2021-05-30 DIAGNOSIS — G9341 Metabolic encephalopathy: Secondary | ICD-10-CM | POA: Diagnosis present

## 2021-05-30 DIAGNOSIS — E785 Hyperlipidemia, unspecified: Secondary | ICD-10-CM | POA: Diagnosis present

## 2021-05-30 DIAGNOSIS — K219 Gastro-esophageal reflux disease without esophagitis: Secondary | ICD-10-CM | POA: Diagnosis present

## 2021-05-30 DIAGNOSIS — N179 Acute kidney failure, unspecified: Secondary | ICD-10-CM | POA: Diagnosis present

## 2021-05-30 DIAGNOSIS — I330 Acute and subacute infective endocarditis: Secondary | ICD-10-CM | POA: Diagnosis present

## 2021-05-30 DIAGNOSIS — Z20822 Contact with and (suspected) exposure to covid-19: Secondary | ICD-10-CM | POA: Diagnosis present

## 2021-05-30 DIAGNOSIS — G8191 Hemiplegia, unspecified affecting right dominant side: Secondary | ICD-10-CM | POA: Diagnosis present

## 2021-05-30 DIAGNOSIS — I6330 Cerebral infarction due to thrombosis of unspecified cerebral artery: Secondary | ICD-10-CM | POA: Insufficient documentation

## 2021-05-30 DIAGNOSIS — I10 Essential (primary) hypertension: Secondary | ICD-10-CM | POA: Diagnosis present

## 2021-05-30 DIAGNOSIS — M545 Low back pain, unspecified: Secondary | ICD-10-CM | POA: Diagnosis present

## 2021-05-30 DIAGNOSIS — I63412 Cerebral infarction due to embolism of left middle cerebral artery: Secondary | ICD-10-CM | POA: Diagnosis present

## 2021-05-30 DIAGNOSIS — F1910 Other psychoactive substance abuse, uncomplicated: Secondary | ICD-10-CM | POA: Insufficient documentation

## 2021-05-30 DIAGNOSIS — Z888 Allergy status to other drugs, medicaments and biological substances status: Secondary | ICD-10-CM | POA: Diagnosis not present

## 2021-05-30 DIAGNOSIS — J45909 Unspecified asthma, uncomplicated: Secondary | ICD-10-CM | POA: Diagnosis present

## 2021-05-30 DIAGNOSIS — R7401 Elevation of levels of liver transaminase levels: Secondary | ICD-10-CM | POA: Insufficient documentation

## 2021-05-30 DIAGNOSIS — R29810 Facial weakness: Secondary | ICD-10-CM | POA: Diagnosis present

## 2021-05-30 DIAGNOSIS — F1410 Cocaine abuse, uncomplicated: Secondary | ICD-10-CM | POA: Diagnosis present

## 2021-05-30 DIAGNOSIS — Z79899 Other long term (current) drug therapy: Secondary | ICD-10-CM | POA: Diagnosis not present

## 2021-05-30 LAB — CBC WITH DIFFERENTIAL/PLATELET
Abs Immature Granulocytes: 0.06 10*3/uL (ref 0.00–0.07)
Basophils Absolute: 0 10*3/uL (ref 0.0–0.1)
Basophils Relative: 0 %
Eosinophils Absolute: 0 10*3/uL (ref 0.0–0.5)
Eosinophils Relative: 0 %
HCT: 43.8 % (ref 39.0–52.0)
Hemoglobin: 15.2 g/dL (ref 13.0–17.0)
Immature Granulocytes: 1 %
Lymphocytes Relative: 6 %
Lymphs Abs: 0.6 10*3/uL — ABNORMAL LOW (ref 0.7–4.0)
MCH: 30.3 pg (ref 26.0–34.0)
MCHC: 34.7 g/dL (ref 30.0–36.0)
MCV: 87.4 fL (ref 80.0–100.0)
Monocytes Absolute: 0.3 10*3/uL (ref 0.1–1.0)
Monocytes Relative: 4 %
Neutro Abs: 7.7 10*3/uL (ref 1.7–7.7)
Neutrophils Relative %: 89 %
Platelets: 187 10*3/uL (ref 150–400)
RBC: 5.01 MIL/uL (ref 4.22–5.81)
RDW: 12.3 % (ref 11.5–15.5)
WBC: 8.7 10*3/uL (ref 4.0–10.5)
nRBC: 0 % (ref 0.0–0.2)

## 2021-05-30 LAB — BASIC METABOLIC PANEL
Anion gap: 11 (ref 5–15)
BUN: 19 mg/dL (ref 6–20)
CO2: 23 mmol/L (ref 22–32)
Calcium: 7.9 mg/dL — ABNORMAL LOW (ref 8.9–10.3)
Chloride: 99 mmol/L (ref 98–111)
Creatinine, Ser: 1.43 mg/dL — ABNORMAL HIGH (ref 0.61–1.24)
GFR, Estimated: >60 mL/min (ref >60)
Glucose, Bld: 128 mg/dL — ABNORMAL HIGH (ref 70–99)
Potassium: 4.6 mmol/L (ref 3.5–5.1)
Sodium: 133 mmol/L — ABNORMAL LOW (ref 135–145)

## 2021-05-30 LAB — HEPATIC FUNCTION PANEL
ALT: 730 U/L — ABNORMAL HIGH (ref 0–44)
AST: 1564 U/L — ABNORMAL HIGH (ref 15–41)
Albumin: 3.4 g/dL — ABNORMAL LOW (ref 3.5–5.0)
Alkaline Phosphatase: 49 U/L (ref 38–126)
Bilirubin, Direct: 0.3 mg/dL — ABNORMAL HIGH (ref 0.0–0.2)
Indirect Bilirubin: 0.8 mg/dL (ref 0.3–0.9)
Total Bilirubin: 1.1 mg/dL (ref 0.3–1.2)
Total Protein: 6.3 g/dL — ABNORMAL LOW (ref 6.5–8.1)

## 2021-05-30 LAB — CSF CELL COUNT WITH DIFFERENTIAL
RBC Count, CSF: 6 /mm3 — ABNORMAL HIGH
RBC Count, CSF: 5 /mm3 — ABNORMAL HIGH
Tube #: 1
Tube #: 4
WBC, CSF: 0 /mm3 (ref 0–5)
WBC, CSF: 1 /mm3 (ref 0–5)

## 2021-05-30 LAB — LIPID PANEL
Cholesterol: 182 mg/dL (ref 0–200)
HDL: 51 mg/dL (ref >40)
LDL Cholesterol: 123 mg/dL — ABNORMAL HIGH (ref 0–99)
Total CHOL/HDL Ratio: 3.6 RATIO
Triglycerides: 42 mg/dL (ref <150)
VLDL: 8 mg/dL (ref 0–40)

## 2021-05-30 LAB — PROTEIN AND GLUCOSE, CSF
Glucose, CSF: 87 mg/dL — ABNORMAL HIGH (ref 40–70)
Total  Protein, CSF: 24 mg/dL (ref 15–45)

## 2021-05-30 LAB — HIV ANTIBODY (ROUTINE TESTING W REFLEX): HIV Screen 4th Generation wRfx: NONREACTIVE

## 2021-05-30 LAB — ECHOCARDIOGRAM COMPLETE
Area-P 1/2: 2.73 cm2
Height: 69 in
S' Lateral: 2.8 cm
Weight: 2880 oz

## 2021-05-30 LAB — ACETAMINOPHEN LEVEL: Acetaminophen (Tylenol), Serum: <10 ug/mL — ABNORMAL LOW (ref 10–30)

## 2021-05-30 LAB — LACTIC ACID, PLASMA: Lactic Acid, Venous: 1.1 mmol/L (ref 0.5–1.9)

## 2021-05-30 LAB — CBG MONITORING, ED
Glucose-Capillary: 141 mg/dL — ABNORMAL HIGH (ref 70–99)
Glucose-Capillary: 125 mg/dL — ABNORMAL HIGH (ref 70–99)

## 2021-05-30 LAB — HEMOGLOBIN A1C
Hgb A1c MFr Bld: 5.6 % (ref 4.8–5.6)
Mean Plasma Glucose: 114 mg/dL

## 2021-05-30 LAB — TROPONIN I (HIGH SENSITIVITY)
Troponin I (High Sensitivity): 1570 ng/L (ref <18)
Troponin I (High Sensitivity): 944 ng/L (ref <18)
Troponin I (High Sensitivity): 840 ng/L (ref <18)

## 2021-05-30 LAB — PROTIME-INR
INR: 1.1 (ref 0.8–1.2)
Prothrombin Time: 14.1 seconds (ref 11.4–15.2)
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
## 2021-05-30 IMAGING — MR MR MRA HEAD W/O CM
1 series · 19 of 48 positions shown · IV contrast (gadavist)
Comparison: Noncontrast head MRI [DATE] and head CT [DATE]

CLINICAL DATA: Headache, intracranial hemorrhage suspected; Neuro
deficit, acute, stroke suspected.

EXAM:
MRI HEAD WITH CONTRAST
MRA HEAD WITHOUT CONTRAST
TECHNIQUE: Multiplanar, multiecho pulse sequences of the brain and surrounding
structures were obtained with intravenous contrast. Angiographic
images of the head were obtained using MRA technique without
contrast.
CONTRAST:  7.5mL GADAVIST GADOBUTROL 1 MMOL/ML IV SOLN

[Series 2: ax (id) · axial · 1.0mm · 0.43mm/px · z∈[-68,+14]mm · 19 of 176 slices shown]
[im 1/176]
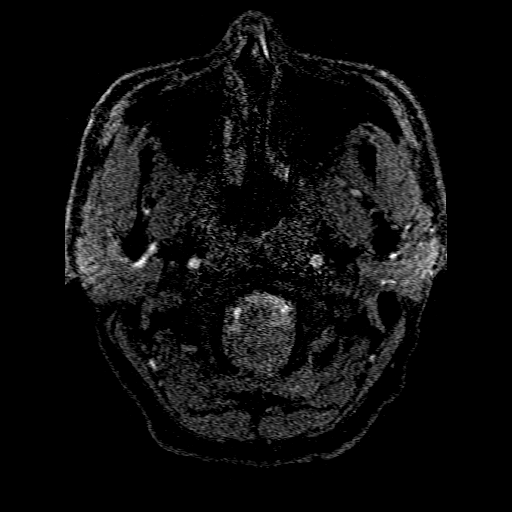
[im 4/176]
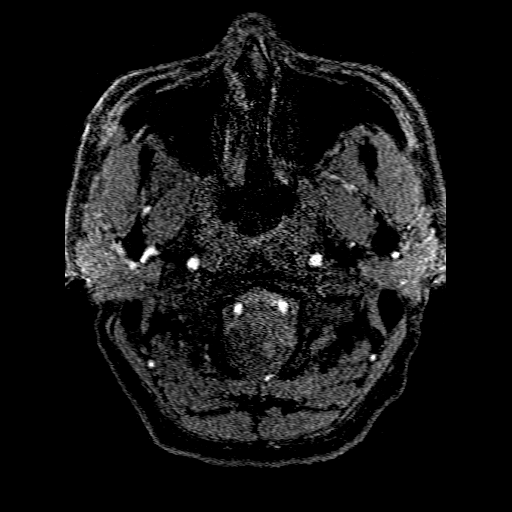
[im 8/176]
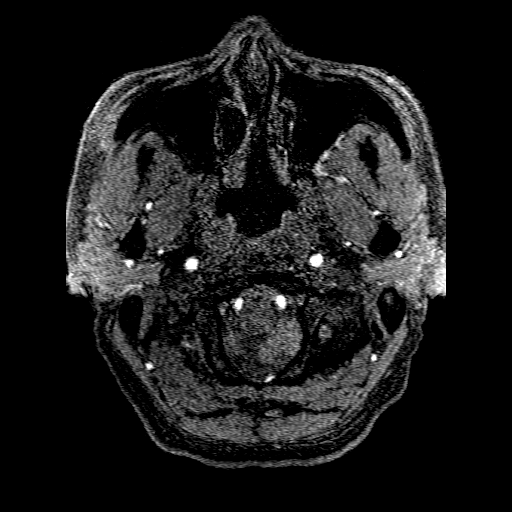
[im 12/176]
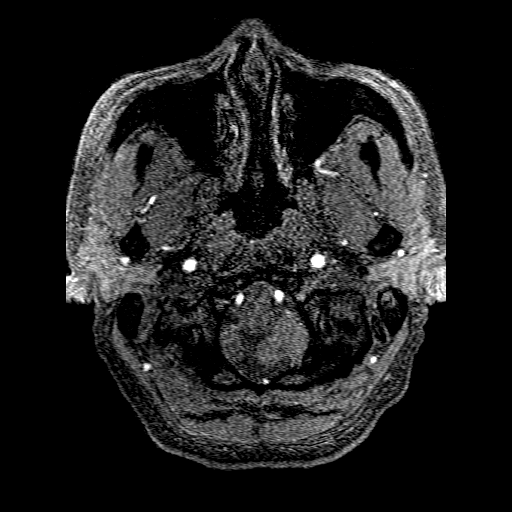
[im 15/176]
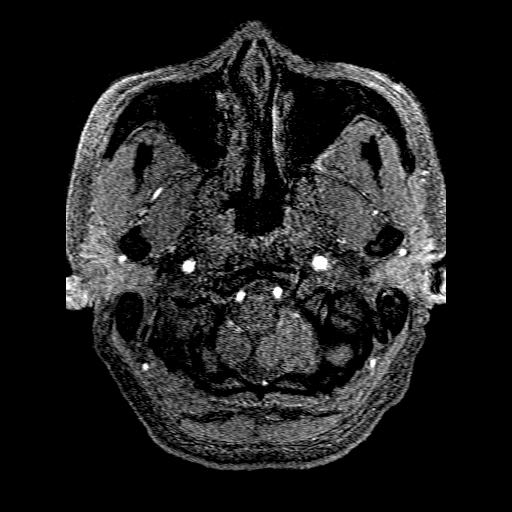
[im 19/176]
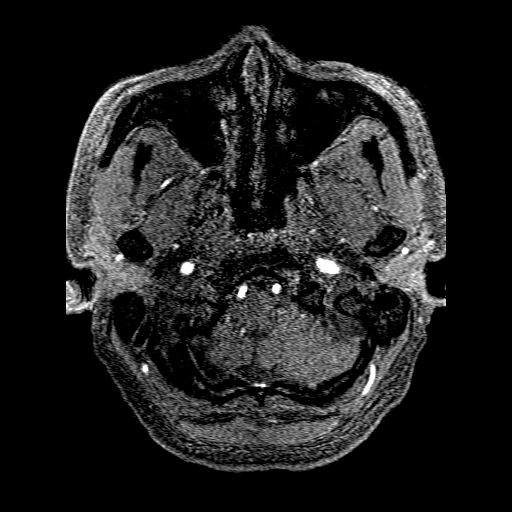
[im 23/176]
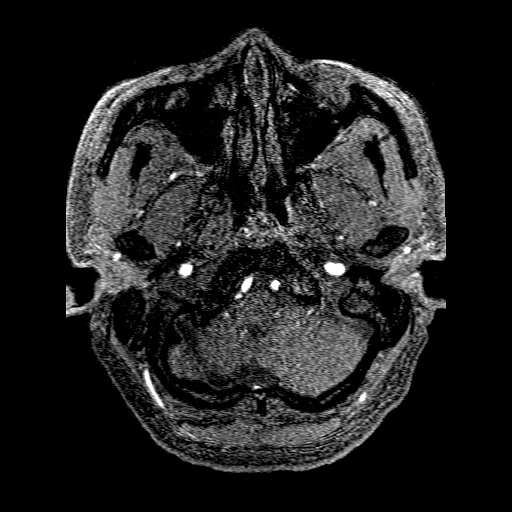
[im 27/176]
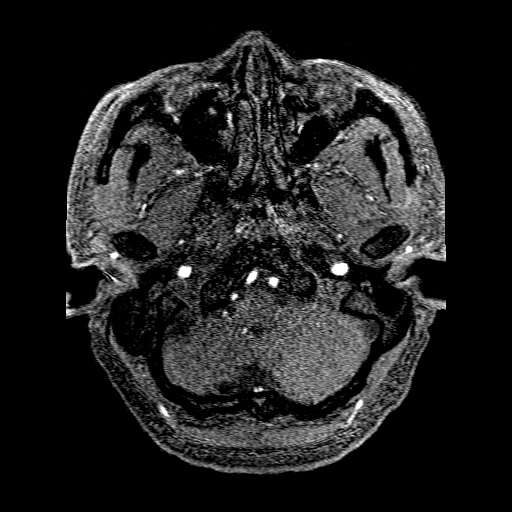
[im 30/176]
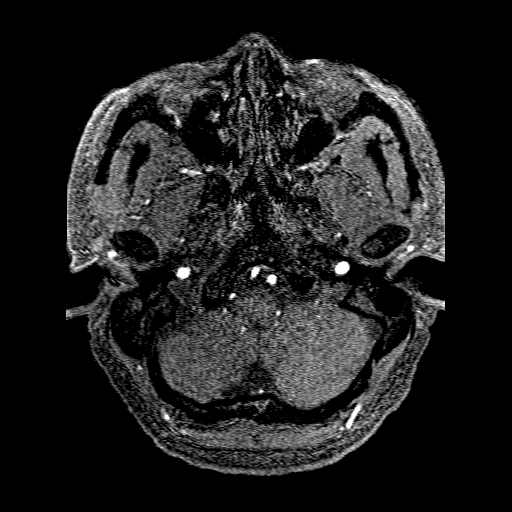
[im 34/176]
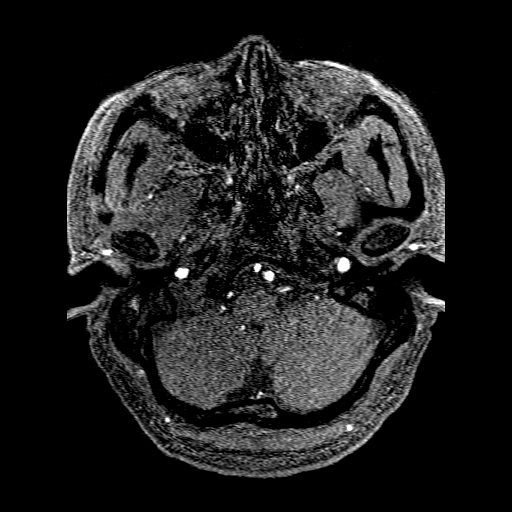
[im 38/176]
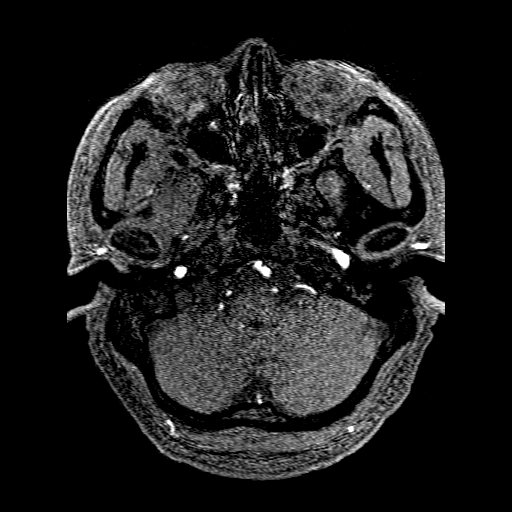
[im 56/176]
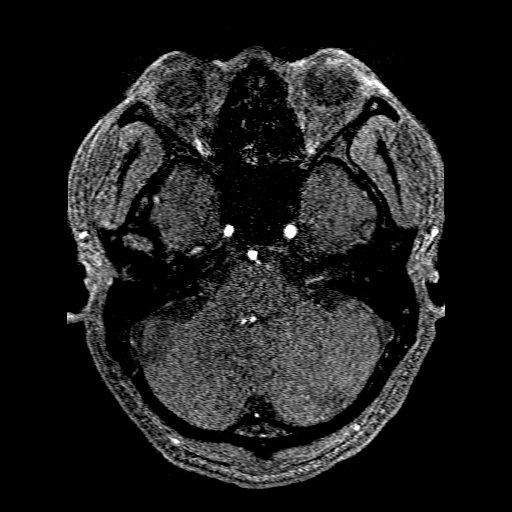
[im 79/176]
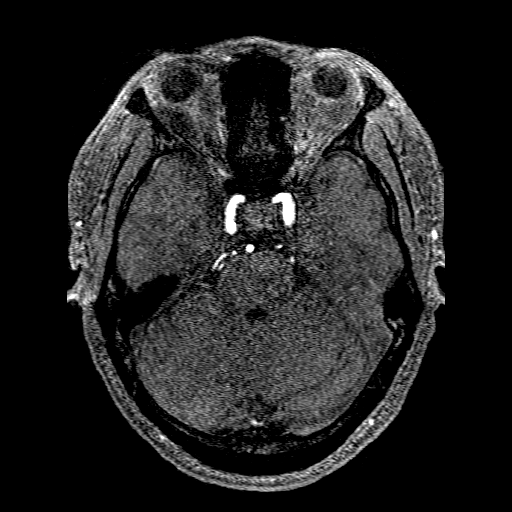
[im 90/176]
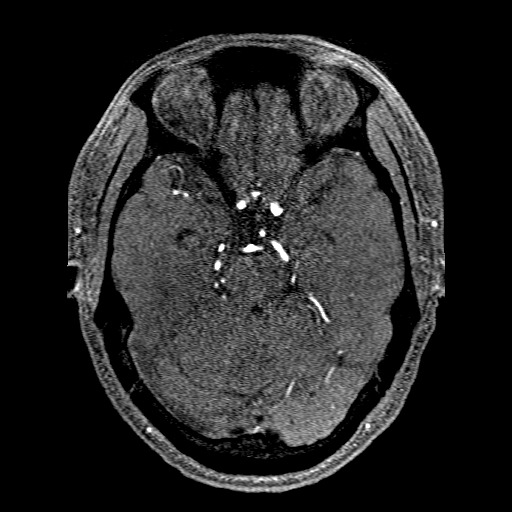
[im 101/176]
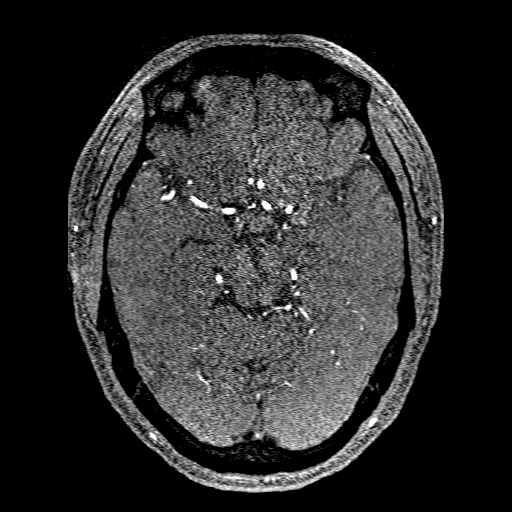
[im 123/176]
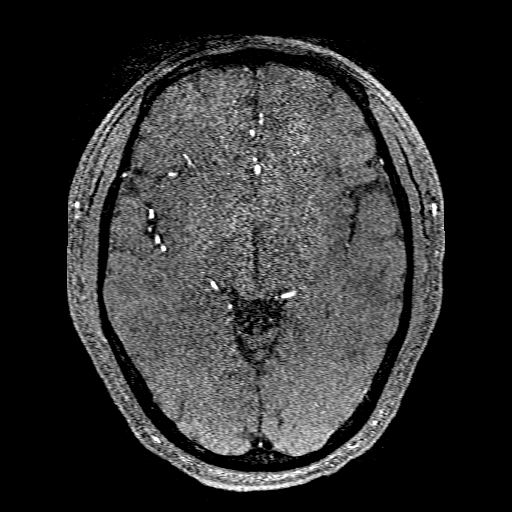
[im 146/176]
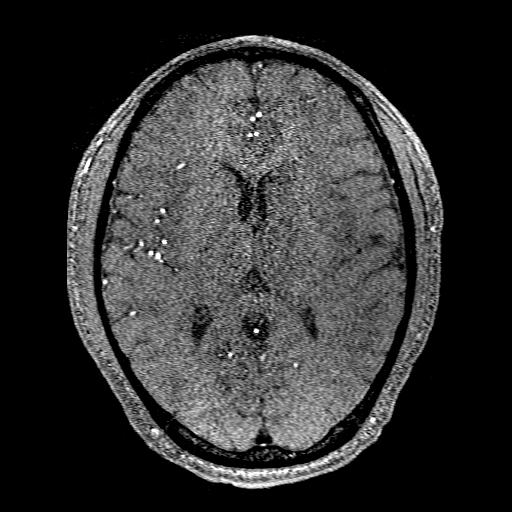
[im 149/176]
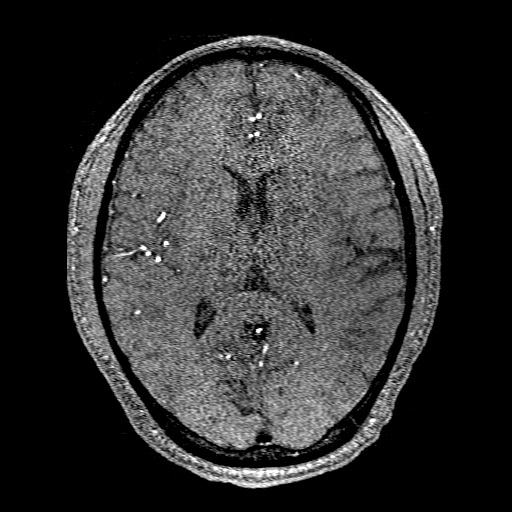
[im 168/176]
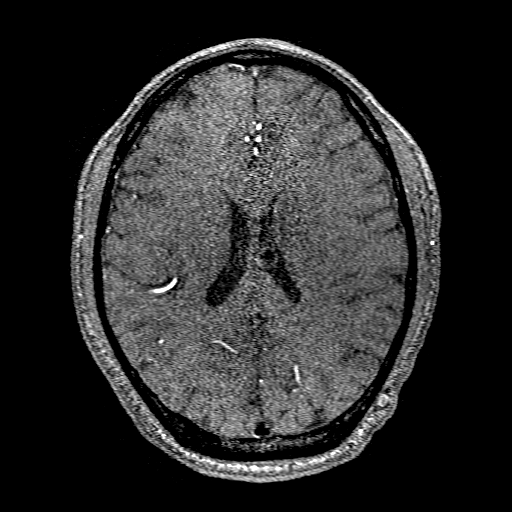

[19 of 48 positions shown; findings below may reference images not displayed]

FINDINGS: MRI HEAD FINDINGS

Minimal enhancement in the regions of the left basal ganglia
infarcts and hemorrhage shown on the prior noncontrast MRI is
vascular in appearance. No masslike or other suspicious enhancement
is identified.

MRA HEAD FINDINGS

The included intracranial portions of the vertebral arteries are
widely patent to the basilar and codominant. Patent PICA, AICA, and
SCA origins are identified bilaterally with a common origin of the
left PICA and AICA noted, a normal variant. The basilar artery is
widely patent. There is a large left posterior communicating artery.
Both PCAs are patent without evidence of a significant proximal
stenosis.

The internal carotid arteries are widely patent from skull base to
carotid termini. The left MCA is occluded at its origin with some
surrounding collateral vessels but only at most faintly
reconstituted distal flow. The right MCA and both ACAs are patent
without evidence of a significant proximal right MCA or left ACA
stenosis. The right A1 segment is hypoplastic with a moderate to
severe stenosis proximally. No aneurysm is identified.
IMPRESSION: 1. Left MCA occlusion.
2. Hypoplastic right A1 segment with moderate to severe proximal
stenosis.
3. No masslike or suspicious intracranial enhancement.

## 2021-05-30 IMAGING — MR MR HEAD W/ CM
4 series · 26 of 48 positions shown · IV contrast (Yes GAD)
Comparison: Noncontrast head MRI [DATE] and head CT [DATE]

CLINICAL DATA: Headache, intracranial hemorrhage suspected; Neuro
deficit, acute, stroke suspected.

EXAM:
MRI HEAD WITH CONTRAST
MRA HEAD WITHOUT CONTRAST
TECHNIQUE: Multiplanar, multiecho pulse sequences of the brain and surrounding
structures were obtained with intravenous contrast. Angiographic
images of the head were obtained using MRA technique without
contrast.
CONTRAST:  7.5mL GADAVIST GADOBUTROL 1 MMOL/ML IV SOLN

[Series 2: T1 · coronal · 5.0mm · 0.43mm/px · 9 of 29 slices shown (1 of 2)]
[im 1/29]
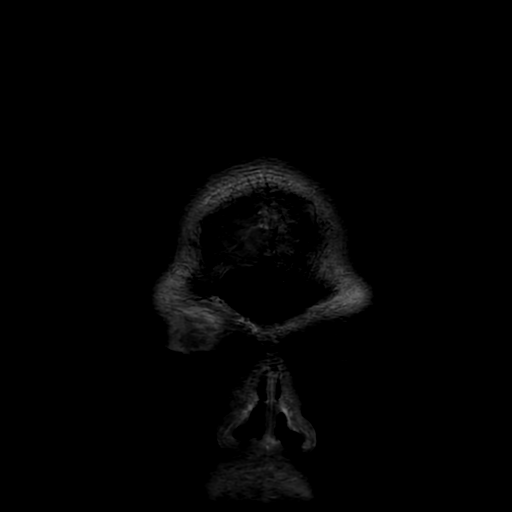
[im 4/29]
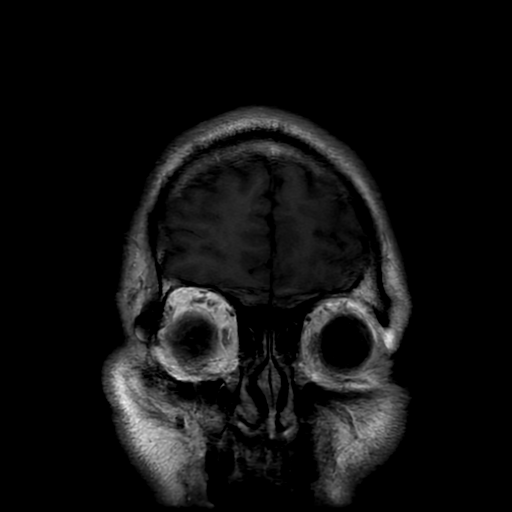
[im 8/29]
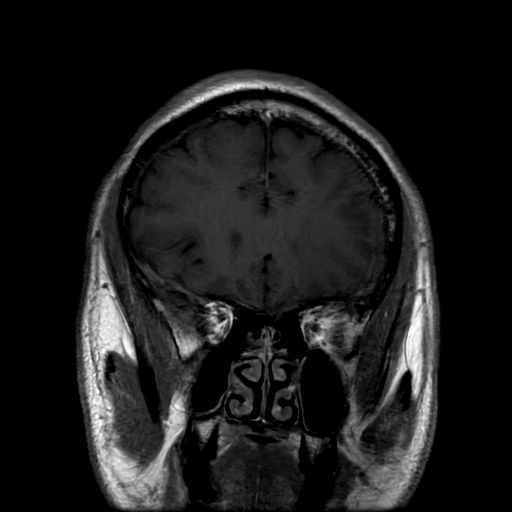
[im 11/29]
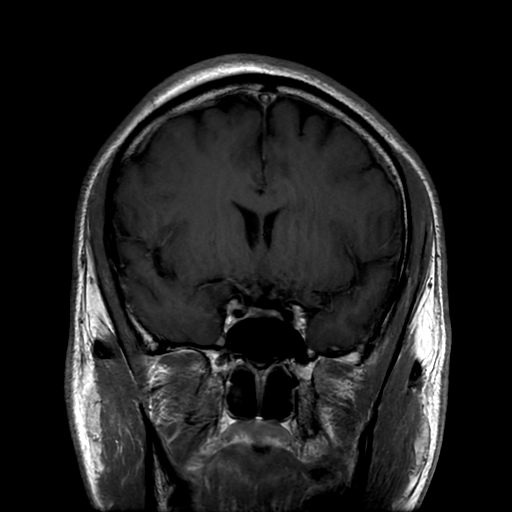
[im 15/29]
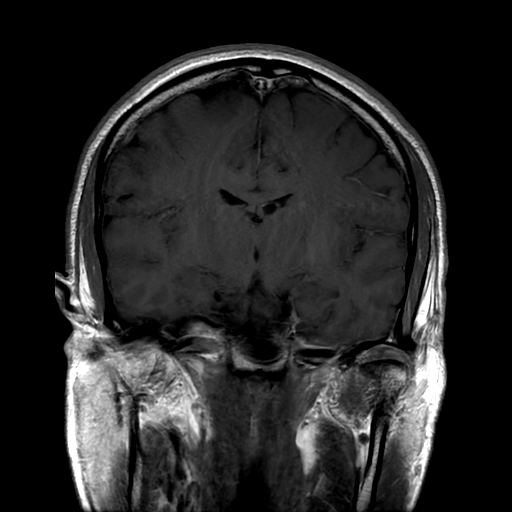
[im 18/29]
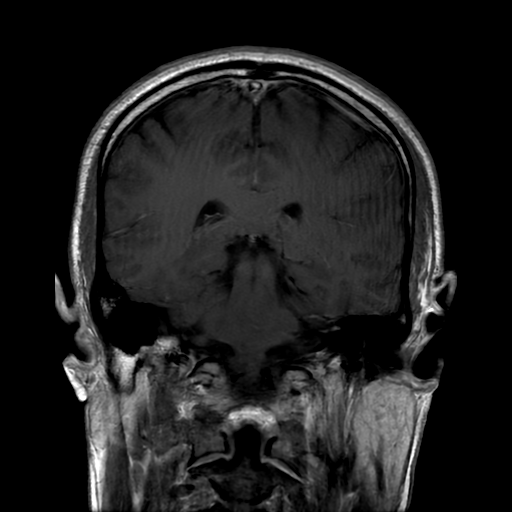
[im 22/29]
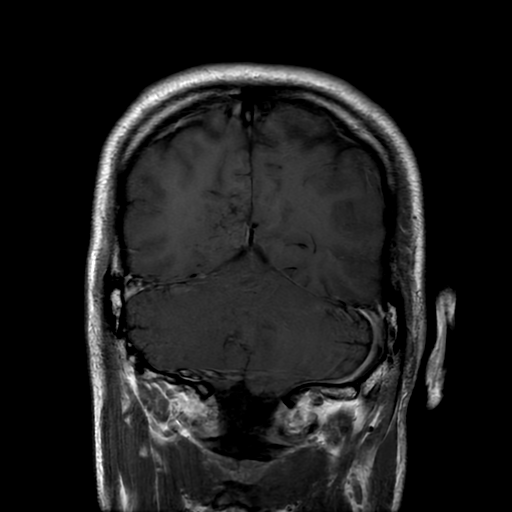
[im 25/29]
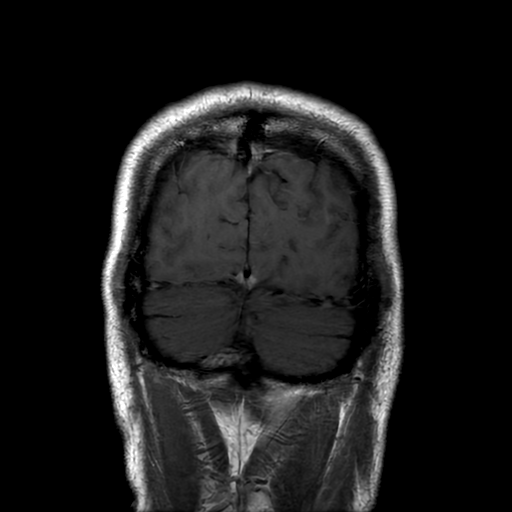
[im 29/29]
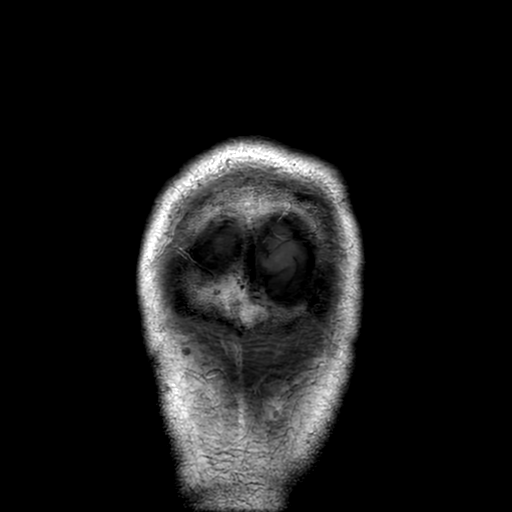

[Series 3: ax 3(person_name) pre · axial · non-contrast · 3.0mm · 0.94mm/px · z∈[-73,+43]mm · 3 of 56 slices shown]
[im 8/56]
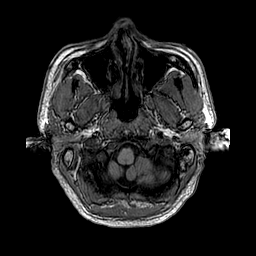
[im 30/56]
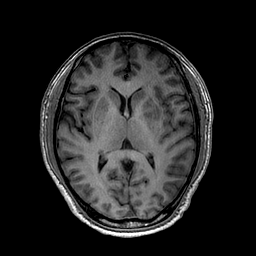
[im 48/56]
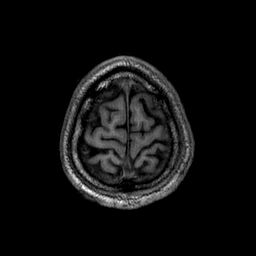

[Series 3: FLAIR post-contrast · sagittal · 5.0mm · 0.47mm/px · 7 of 26 slices shown]
[im 1/26]
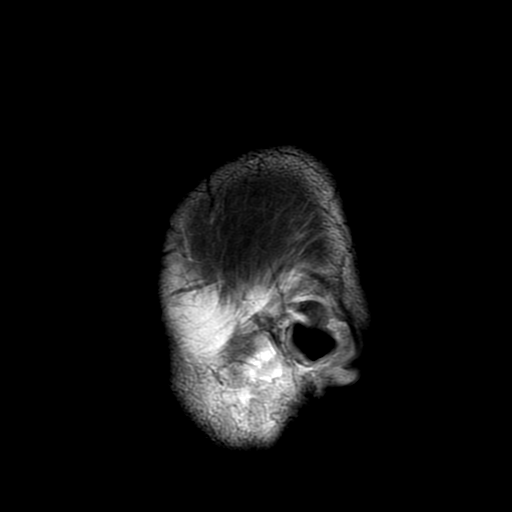
[im 5/26]
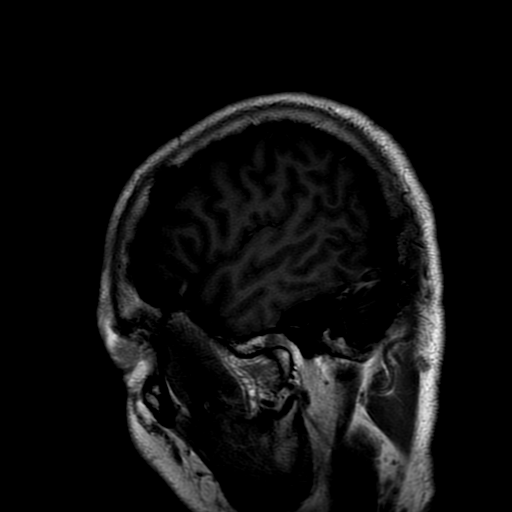
[im 9/26]
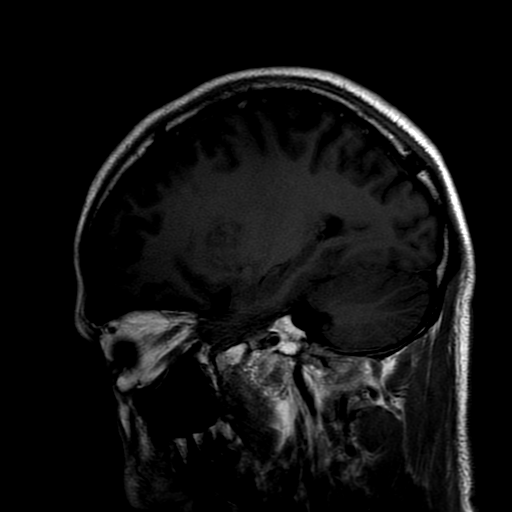
[im 13/26]
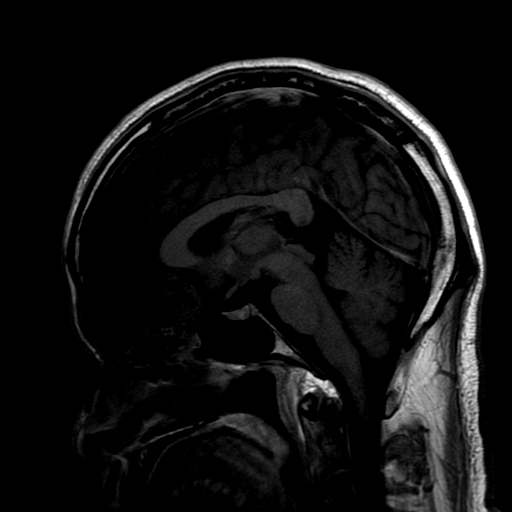
[im 17/26]
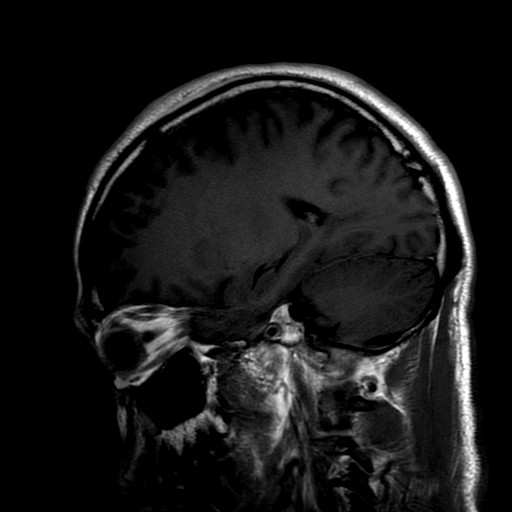
[im 21/26]
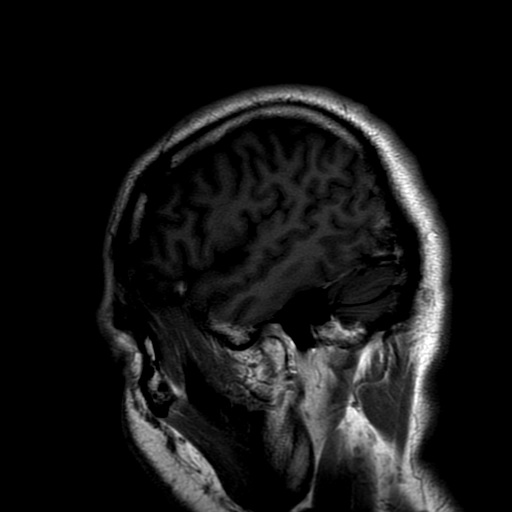
[im 26/26]
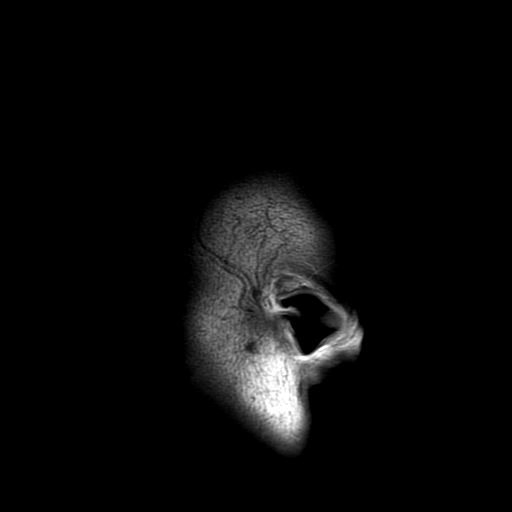

[Series 4: T1 · axial · 3.0mm · 0.94mm/px · z∈[-85,+43]mm · 7 of 56 slices shown (2 of 2)]
[im 4/56]
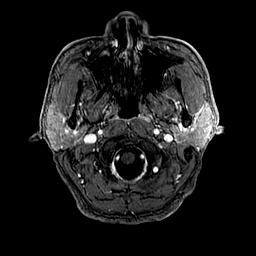
[im 8/56]
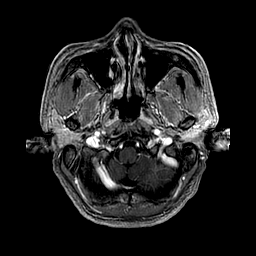
[im 12/56]
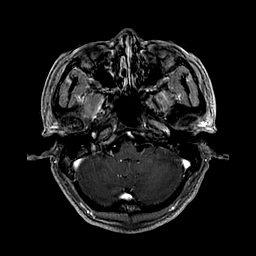
[im 19/56]
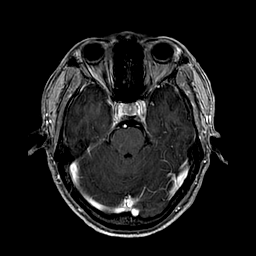
[im 26/56]
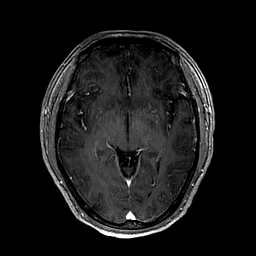
[im 30/56]
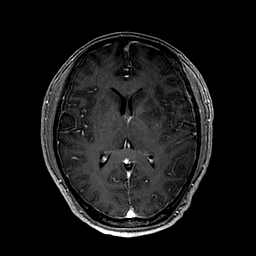
[im 48/56]
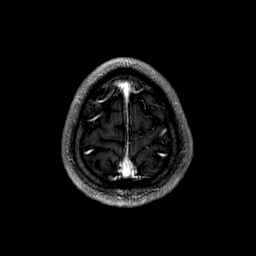

[26 of 48 positions shown; findings below may reference images not displayed]

FINDINGS: MRI HEAD FINDINGS

Minimal enhancement in the regions of the left basal ganglia
infarcts and hemorrhage shown on the prior noncontrast MRI is
vascular in appearance. No masslike or other suspicious enhancement
is identified.

MRA HEAD FINDINGS

The included intracranial portions of the vertebral arteries are
widely patent to the basilar and codominant. Patent PICA, AICA, and
SCA origins are identified bilaterally with a common origin of the
left PICA and AICA noted, a normal variant. The basilar artery is
widely patent. There is a large left posterior communicating artery.
Both PCAs are patent without evidence of a significant proximal
stenosis.

The internal carotid arteries are widely patent from skull base to
carotid termini. The left MCA is occluded at its origin with some
surrounding collateral vessels but only at most faintly
reconstituted distal flow. The right MCA and both ACAs are patent
without evidence of a significant proximal right MCA or left ACA
stenosis. The right A1 segment is hypoplastic with a moderate to
severe stenosis proximally. No aneurysm is identified.
IMPRESSION: 1. Left MCA occlusion.
2. Hypoplastic right A1 segment with moderate to severe proximal
stenosis.
3. No masslike or suspicious intracranial enhancement.

## 2021-05-30 MED ORDER — SODIUM CHLORIDE 0.9 % IV SOLN
2.0000 g | Freq: Four times a day (QID) | INTRAVENOUS | Status: DC
Start: 2021-05-30 — End: 2021-05-31
  Administered 2021-05-30 – 2021-05-31 (×6): 2 g via INTRAVENOUS
  Filled 2021-05-30: qty 2
  Filled 2021-05-30 (×3): qty 2000
  Filled 2021-05-30: qty 2
  Filled 2021-05-30 (×2): qty 2000
  Filled 2021-05-30: qty 2

## 2021-05-30 MED ORDER — GADOBUTROL 1 MMOL/ML IV SOLN
7.5000 mL | Freq: Once | INTRAVENOUS | Status: AC | PRN
  Administered 2021-05-30: 7.5 mL via INTRAVENOUS

## 2021-05-30 MED ORDER — DEXTROSE 5 % IV SOLN
750.0000 mg | Freq: Once | INTRAVENOUS | Status: AC
  Administered 2021-05-30: 750 mg via INTRAVENOUS
  Filled 2021-05-30: qty 15

## 2021-05-30 MED ORDER — DEXTROSE 5 % IV SOLN
10.0000 mg/kg | Freq: Three times a day (TID) | INTRAVENOUS | Status: DC
  Administered 2021-05-30: 815 mg via INTRAVENOUS
  Filled 2021-05-30 (×2): qty 16.3

## 2021-05-30 MED ORDER — SODIUM CHLORIDE 0.9 % IV SOLN: INTRAVENOUS | Status: DC

## 2021-05-30 MED ORDER — DEXTROSE 5 % IV SOLN
750.0000 mg | Freq: Two times a day (BID) | INTRAVENOUS | Status: DC
  Filled 2021-05-30: qty 15

## 2021-05-30 MED ORDER — SODIUM CHLORIDE 0.9 % IV SOLN
2.0000 g | Freq: Two times a day (BID) | INTRAVENOUS | Status: DC
  Administered 2021-05-30 (×2): 2 g via INTRAVENOUS
  Filled 2021-05-30: qty 20
  Filled 2021-05-30: qty 2
  Filled 2021-05-30 (×2): qty 20

## 2021-05-30 MED ORDER — DEXTROSE 5 % IV SOLN
10.0000 mg/kg | Freq: Three times a day (TID) | INTRAVENOUS | Status: DC
  Administered 2021-05-30 – 2021-05-31 (×2): 815 mg via INTRAVENOUS
  Filled 2021-05-30 (×4): qty 16.3

## 2021-05-30 MED ORDER — CHLORHEXIDINE GLUCONATE CLOTH 2 % EX PADS
6.0000 | MEDICATED_PAD | Freq: Every day | CUTANEOUS | Status: DC
  Administered 2021-05-30 – 2021-06-04 (×6): 6 via TOPICAL

## 2021-05-30 MED ORDER — LIDOCAINE HCL (PF) 1 % IJ SOLN
5.0000 mL | Freq: Once | INTRAMUSCULAR | Status: DC
  Filled 2021-05-30: qty 5
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
## 2021-05-30 NOTE — TOC CAGE-AID Note (Signed)
Transition of Care [facility]) - CAGE-AID Screening   Patient Details  Name: Anthony Ward MRN: [id number] Date of Birth: [birth year]  Transition of Care [facility] CM/SW Contact:    Rosaleen Mazer C Tarpley-Carter, LCSWA Phone Number: 05/30/2021, 2:53 PM   Clinical Narrative: Pt is unable to participate in Cage Aid. Pt is experiencing confusion.  Doug Bucklin Tarpley-Carter, MSW, LCSW-A Pronouns:  She/Her/Hers [facility] Clinical Social Worker Direct Number:  [phone] [email or web address]   CAGE-AID Screening: Substance Abuse Screening unable to be completed due to: : Patient unable to participate (Pt is confused.)                Substance abuse interventions: Transport planner

## 2021-05-30 NOTE — Progress Notes (Signed)
Carotid duplex bilateral study completed.   Please see CV Proc for preliminary results.   Cliffie Gingras, RDMS, RVT  

## 2021-05-30 NOTE — Procedures (Signed)
Indication:  Risks of the procedure were dicussed with the patient and his sister including post-LP headache, bleeding, infection, weakness/numbness of legs(radiculopathy), death.  The patient/patient's proxy agreed and written consent was obtained.   The patient was prepped and draped, and using sterile technique a 20 gauge quinke spinal needle was inserted in the L4-5 space. The opening pressure was not measured. Approximately 13 cc of CSF were obtained and sent for analysis.   Erick Blinks [facility] Pager Number [phone]

## 2021-05-30 NOTE — ED Notes (Signed)
Back to room at this time.

## 2021-05-30 NOTE — ED Notes (Signed)
Dr. Toniann Fail made aware of done EKG.

## 2021-05-30 NOTE — Progress Notes (Signed)
Pharmacy Antimicrobial Note  Anthony Ward is a 35 y.o. male admitted on 05/29/2021 with concern for meningitis vs herpes enchephalitis.  Pharmacy has been consulted for acyclovir dosing.  Plan: Acyclovir 750mg  IV Q12H. Change ampicillin to Q6H for CrCl <50.  Height: 5\' 9"  (175.3 cm) Weight: 81.6 kg (180 lb) IBW/kg (Calculated) : 70.7  Temp (24hrs), Avg:100 F (37.8 C), Min:99.6 F (37.6 C), Max:100.5 F (38.1 C)  Recent Labs  Lab 05/29/21 1835 05/29/21 1850 05/29/21 1901 05/29/21 2035  WBC 9.4  --   --   --   CREATININE 2.10*  --  2.20*  --   LATICACIDVEN  --  2.9*  --  2.0*    Estimated Creatinine Clearance: 47.3 mL/min (A) (by C-G formula based on SCr of 2.2 mg/dL (H)).     Thank you for allowing pharmacy to be a part of this patient's care.  05/31/21, PharmD, BCPS  05/30/2021 1:53 AM

## 2021-05-30 NOTE — Progress Notes (Signed)
  Echocardiogram 2D Echocardiogram has been performed.  Elda Dunkerson G Lalania Haseman 05/30/2021, 10:12 AM

## 2021-05-30 NOTE — Progress Notes (Signed)
Brief same-day note:  Patient is a 35 year old male with history of drug abuse who was brought here for the evaluation of confusion.  On presentation he was mildly febrile.  Lab work showed elevated liver enzymes, elevated troponin.  CT head showed left-sided focal hemorrhages.  CT imaging also showed possible aspiration pneumonia.  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 There is also concern for possible meningitis/encephalitis for which lumbar puncture has been done.  Patient has been started on empiric antibiotics.  Cardiology also following for elevated troponin, plan for TEE. Patient seen and examined the bedside this afternoon.  He was hemodynamically stable during my evaluation.  No other at the bedside.  He was alert, awake and oriented to place.  Other examination was unremarkable. CSF analysis did not show any organism, white cell  count of just 1.  We will follow-up CSF fluid / blood cultures. Will continue current management.

## 2021-05-30 NOTE — ED Notes (Signed)
Neurology aware all materials ready at bedside at this time.
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
## 2021-05-30 NOTE — H&P (View-Only) (Signed)
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 of transesophageal echocardiogram have been explained including risks of esophageal damage, perforation (1:10,000 risk), bleeding, pharyngeal hematoma as well as other potential complications associated with conscious sedation including aspiration, arrhythmia, respiratory failure and death. Alternatives to treatment were discussed, questions were answered. Patient is willing to proceed.    Risk Assessment/Risk Scores:  {   For questions or updates, please contact [facility] Please consult [email or web address] for contact info under    Signed, Angela Nicole Duke, PA  05/30/2021 1:02 PM  I have seen and examined the patient along with Angela Nicole Duke, PA .  I have reviewed the chart, notes and new data.  I agree with PA/NP's note.  Key new complaints: sleepy, but answers appropriately. Denies recent dental procedures, new tattoos or ever using IV drugs. Key examination changes: multiple  shallow skin abrasions, multiple elaborate tattoos, no track marks. Limited neuro exam w R facial weakness Key new findings / data: no vegetations on transthoracic echo (normal), CSF normal, WBC normal, markedly elevated transaminases, AST = 2x ALT, normal bili, abnormal UA w protein, blood and nitrite +ve, cellular casts present  MRA shows occluded L MCA, L basal ganglia infarcts and hemorrhage  PLAN: TEE to exclude endocarditis or other cardioembolic source.  Bailen Geffre, MD, FACC [facility] [phone] 05/30/2021, 3:04 PM   

## 2021-05-30 NOTE — ED Notes (Signed)
Patient transported to MRI 

## 2021-05-30 NOTE — ED Notes (Signed)
Called regarding add on -troponin and Hemoglobin A1C.

## 2021-05-30 NOTE — ED Notes (Signed)
Patient returned from MRI and now going to vascular with transport. Brother at bedside.

## 2021-05-30 NOTE — TOC CAGE-AID Note (Deleted)
Transition of Care [facility]) - CAGE-AID Screening   Patient Details  Name: Anthony Ward MRN: [id number] Date of Birth: 06/05/1986  Transition of Care [facility] CM/SW Contact:    Coleby Yett C Tarpley-Carter, LCSWA Phone Number: 05/30/2021, 2:45 PM   Clinical Narrative: Pt is unable to participate in Cage Aid. Pt is experiencing confusion.  Oluwatimileyin Vivier Tarpley-Carter, MSW, LCSW-A Pronouns:  She/Her/Hers [facility] Clinical Social Worker Direct Number:  [phone] [email or web address]   CAGE-AID Screening:    Have You Ever Felt You Ought to Cut Down on Your Drinking or Drug Use?: Yes Have People Annoyed You By [employer] Your Drinking Or Drug Use?: Yes Have You Felt Bad Or Guilty About Your Drinking Or Drug Use?: Yes Have You Ever Had a Drink or Used Drugs First Thing In The Morning to Steady Your Nerves or to Get Rid of a Hangover?: No CAGE-AID Score: 3  Substance Abuse Education Offered: Yes  Substance abuse interventions: Transport planner

## 2021-05-30 NOTE — Consult Note (Addendum)
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 careful review of history and examination, the risks and benefits of transesophageal echocardiogram have been explained including risks of esophageal damage, perforation (1:10,000 risk), bleeding, pharyngeal hematoma as well as other potential complications associated with conscious sedation including aspiration, arrhythmia, respiratory failure and death. Alternatives to treatment were discussed, questions were answered. Patient is willing to proceed.    Risk Assessment/Risk Scores:  {   For questions or updates, please contact [facility] Please consult [email or web address] for contact info under    Signed, Marcelino Duster, PA  05/30/2021 1:02 PM  I have seen and examined the patient along with Marcelino Duster, PA .  I have reviewed the chart, notes and new data.  I agree with PA/NP's note.  Key new complaints: sleepy, but answers appropriately. Denies recent dental procedures, new tattoos or ever using IV drugs. Key examination changes: multiple  shallow skin abrasions, multiple elaborate tattoos, no track marks. Limited neuro exam w R facial weakness Key new findings / data: no vegetations on transthoracic echo (normal), CSF normal, WBC normal, markedly elevated transaminases, AST = 2x ALT, normal bili, abnormal UA w protein, blood and nitrite +ve, cellular casts present  MRA shows occluded L MCA, L basal ganglia infarcts and hemorrhage  PLAN: TEE to exclude endocarditis or other cardioembolic source.  Thurmon Fair, MD, [facility] [phone] 05/30/2021, 3:04 PM

## 2021-05-31 ENCOUNTER — Inpatient Hospital Stay (HOSPITAL_COMMUNITY): Payer: BLUE CROSS/BLUE SHIELD

## 2021-05-31 DIAGNOSIS — I639 Cerebral infarction, unspecified: Secondary | ICD-10-CM

## 2021-05-31 LAB — COMPREHENSIVE METABOLIC PANEL
ALT: 600 U/L — ABNORMAL HIGH (ref 0–44)
AST: 986 U/L — ABNORMAL HIGH (ref 15–41)
Albumin: 2.7 g/dL — ABNORMAL LOW (ref 3.5–5.0)
Alkaline Phosphatase: 42 U/L (ref 38–126)
Anion gap: 6 (ref 5–15)
BUN: 13 mg/dL (ref 6–20)
CO2: 28 mmol/L (ref 22–32)
Calcium: 7.8 mg/dL — ABNORMAL LOW (ref 8.9–10.3)
Chloride: 102 mmol/L (ref 98–111)
Creatinine, Ser: 1 mg/dL (ref 0.61–1.24)
GFR, Estimated: >60 mL/min (ref >60)
Glucose, Bld: 141 mg/dL — ABNORMAL HIGH (ref 70–99)
Potassium: 4.1 mmol/L (ref 3.5–5.1)
Sodium: 136 mmol/L (ref 135–145)
Total Bilirubin: 0.9 mg/dL (ref 0.3–1.2)
Total Protein: 5.1 g/dL — ABNORMAL LOW (ref 6.5–8.1)

## 2021-05-31 LAB — CBC WITH DIFFERENTIAL/PLATELET
Abs Immature Granulocytes: 0.04 10*3/uL (ref 0.00–0.07)
Basophils Absolute: 0 10*3/uL (ref 0.0–0.1)
Basophils Relative: 0 %
Eosinophils Absolute: 0 10*3/uL (ref 0.0–0.5)
Eosinophils Relative: 0 %
HCT: 39.8 % (ref 39.0–52.0)
Hemoglobin: 14.1 g/dL (ref 13.0–17.0)
Immature Granulocytes: 0 %
Lymphocytes Relative: 11 %
Lymphs Abs: 1.1 10*3/uL (ref 0.7–4.0)
MCH: 30.3 pg (ref 26.0–34.0)
MCHC: 35.4 g/dL (ref 30.0–36.0)
MCV: 85.4 fL (ref 80.0–100.0)
Monocytes Absolute: 1 10*3/uL (ref 0.1–1.0)
Monocytes Relative: 10 %
Neutro Abs: 7.9 10*3/uL — ABNORMAL HIGH (ref 1.7–7.7)
Neutrophils Relative %: 79 %
Platelets: 209 10*3/uL (ref 150–400)
RBC: 4.66 MIL/uL (ref 4.22–5.81)
RDW: 12.2 % (ref 11.5–15.5)
WBC: 10 10*3/uL (ref 4.0–10.5)
nRBC: 0 % (ref 0.0–0.2)

## 2021-05-31 LAB — GLUCOSE, CAPILLARY
Glucose-Capillary: 115 mg/dL — ABNORMAL HIGH (ref 70–99)
Glucose-Capillary: 139 mg/dL — ABNORMAL HIGH (ref 70–99)
Glucose-Capillary: 147 mg/dL — ABNORMAL HIGH (ref 70–99)
Glucose-Capillary: 149 mg/dL — ABNORMAL HIGH (ref 70–99)
Glucose-Capillary: 95 mg/dL (ref 70–99)

## 2021-05-31 LAB — HEPATITIS PANEL, ACUTE
HCV Ab: NONREACTIVE
Hep A IgM: NONREACTIVE
Hep B C IgM: NONREACTIVE
Hepatitis B Surface Ag: NONREACTIVE

## 2021-05-31 IMAGING — US US HEPATIC LIVER DOPPLER
1 series · 14 of 25 positions shown · non-contrast
Comparison: None.

CLINICAL DATA: Elevated liver enzymes and history of drug and
alcohol abuse.

EXAM:
DUPLEX ULTRASOUND OF LIVER
TECHNIQUE: Color and duplex Doppler ultrasound was performed to evaluate the
hepatic in-flow and out-flow vessels.

[Series 1: us liver doppler · 14 of 97 slices shown]
[im 1/97]
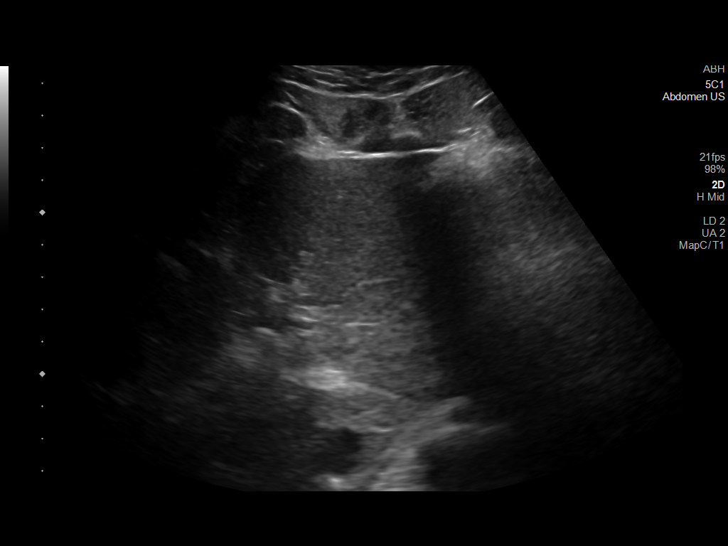
[im 9/97]
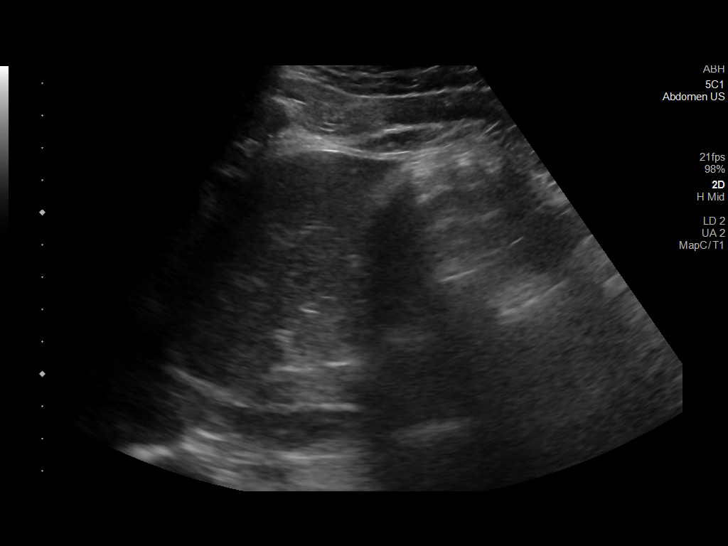
[im 17/97]
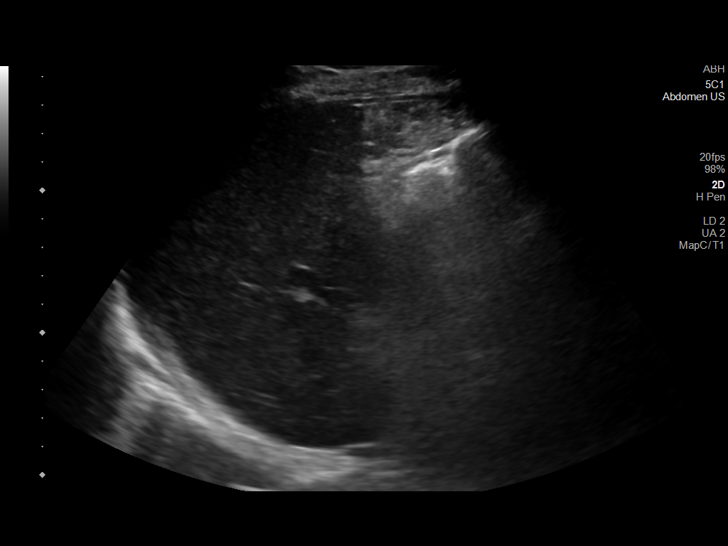
[im 25/97]
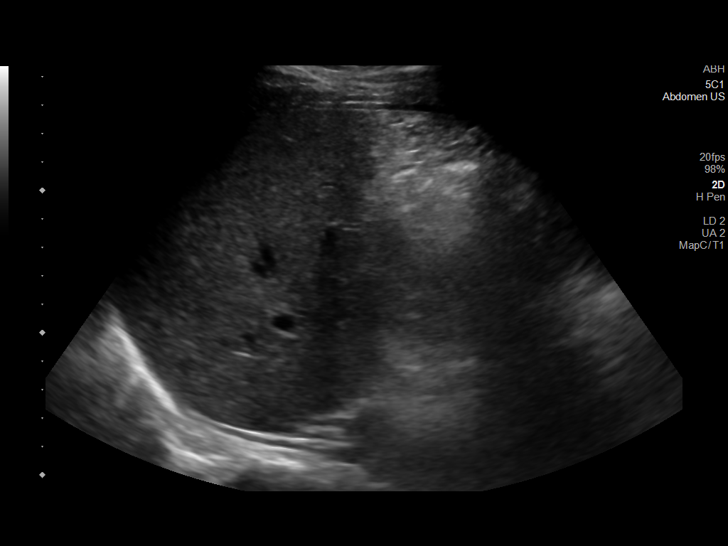
[im 33/97]
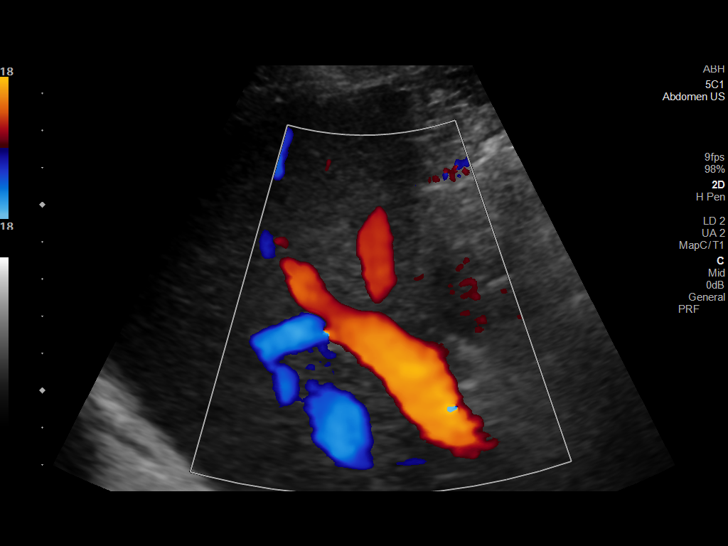
[im 37/97]
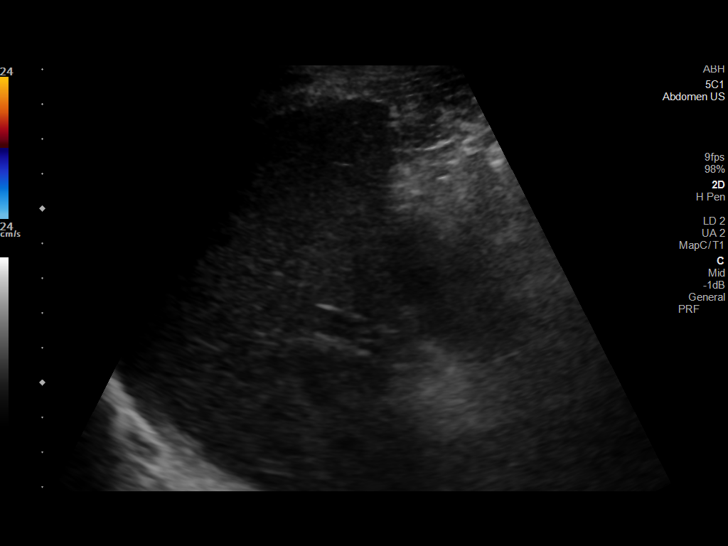
[im 45/97]
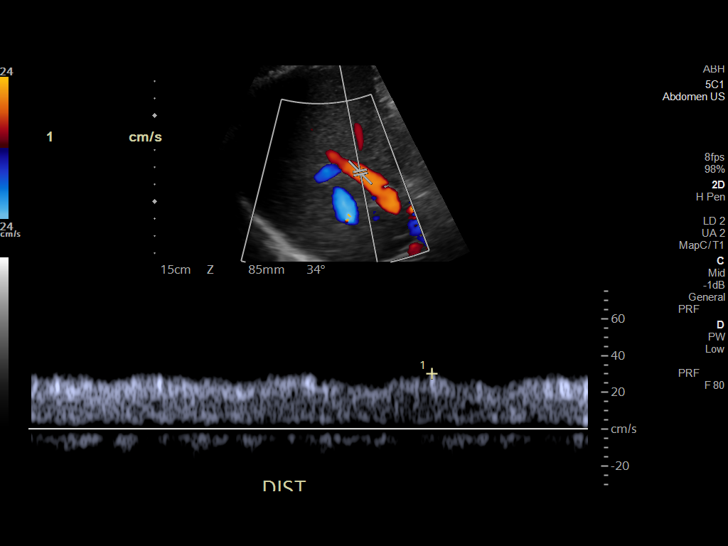
[im 53/97]
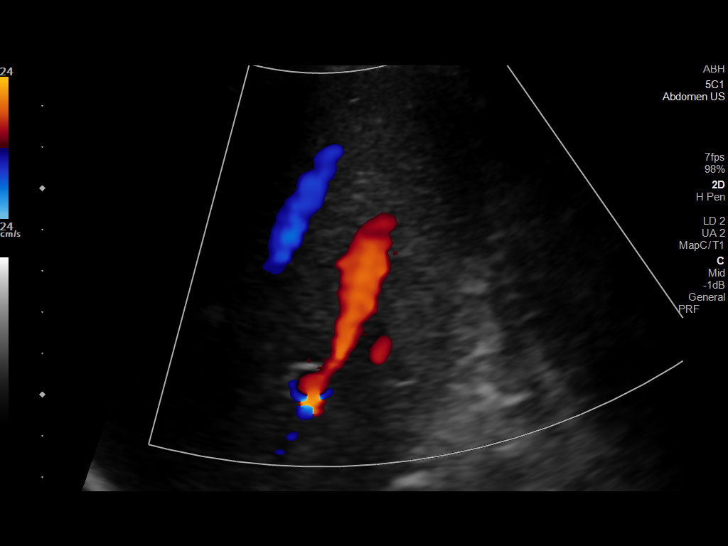
[im 61/97]
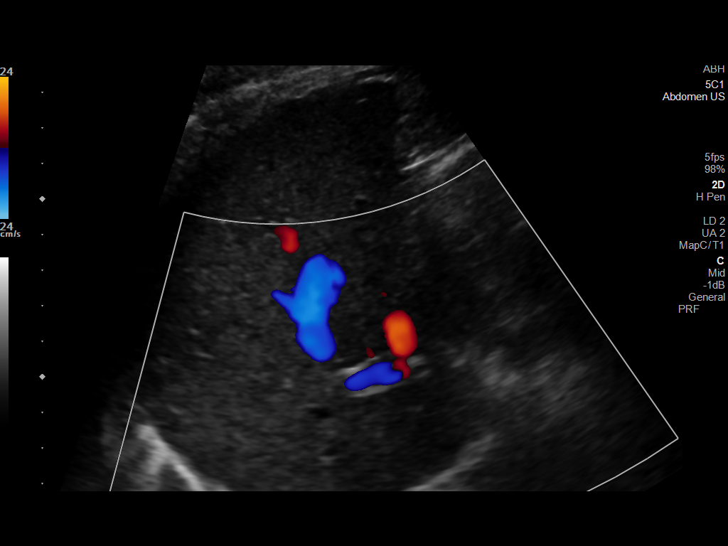
[im 65/97]
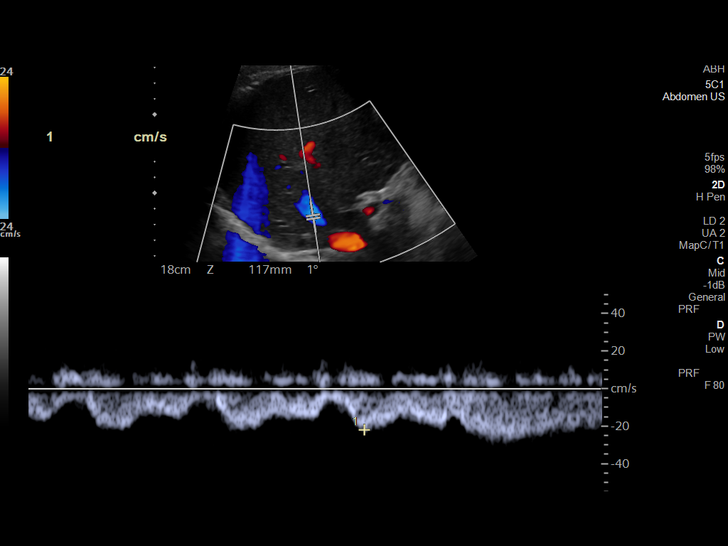
[im 73/97]
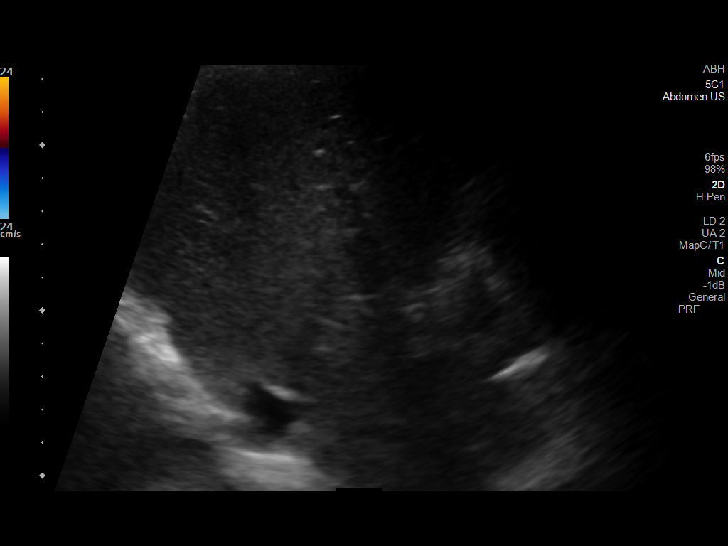
[im 81/97]
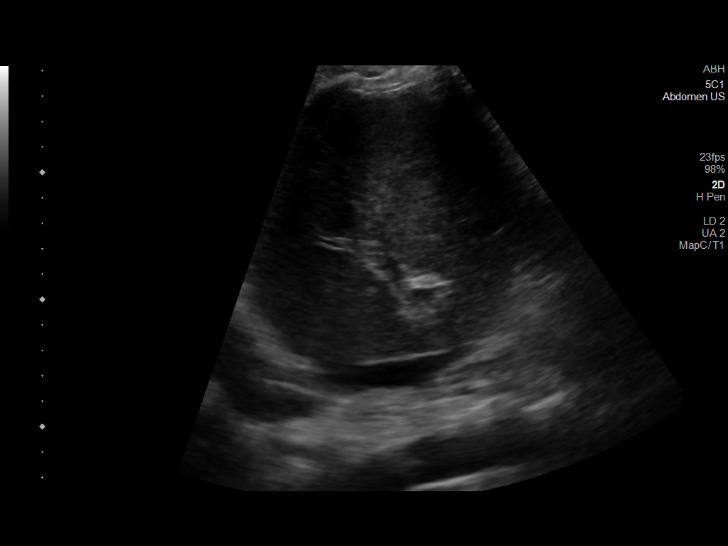
[im 89/97]
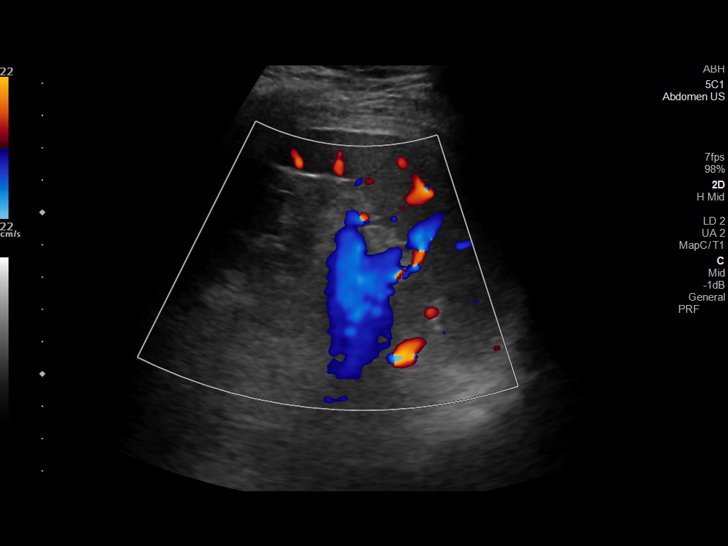
[im 97/97]
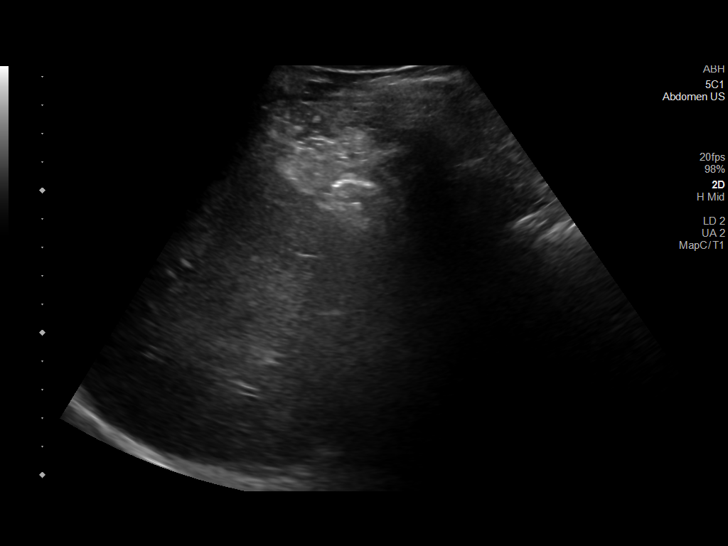

[14 of 25 positions shown; findings below may reference images not displayed]

FINDINGS: Portal Vein Velocities

Main:  27-31 cm/sec

Right:  23 cm/sec

Left:  19 cm/sec

Normally patent portal vein with no evidence of thrombus. Direction
of portal vein flow is towards the liver. Portal waveforms are
within normal limits.

Hepatic Vein Velocities

Right:  22 cm/sec

Middle:  25 cm/sec

Left:  17 cm/sec

Normal patent vein waveforms. No evidence of hepatic COPETE
disease.

Hepatic Artery Velocity:  32 cm/sec

Splenic Vein Velocity:  14 cm/sec

Varices: None visualized.

Ascites: None visualized.

The spleen is normal in size. The liver parenchyma is heterogeneous
with increased parenchymal echogenicity. Findings are consistent
with at least steatosis.
IMPRESSION: 1. No evidence of portal vein thrombosis or significant portal
hypertension by hepatic duplex ultrasound.
2. Echogenic liver parenchyma consistent with at least steatosis.

## 2021-05-31 MED ORDER — HEPARIN SODIUM (PORCINE) 5000 UNIT/ML IJ SOLN
5000.0000 [IU] | Freq: Three times a day (TID) | INTRAMUSCULAR | Status: DC
  Administered 2021-05-31 – 2021-06-04 (×12): 5000 [IU] via SUBCUTANEOUS
  Filled 2021-05-31 (×12): qty 1

## 2021-05-31 MED ORDER — ONDANSETRON HCL 4 MG PO TABS: 4.0000 mg | ORAL_TABLET | Freq: Three times a day (TID) | ORAL | Status: DC | PRN

## 2021-05-31 NOTE — Progress Notes (Signed)
SLP Cancellation Note  Patient Details Name: Anthony Ward MRN: [id number] DOB: 09/21/1986   Cancelled treatment:       Reason Eval/Treat Not Completed: Other (comment) (NPO for procedure); will follow up for completion of clinical swallow evaluation as appropriate.   Ardyth Gal MA, CCC-SLP Acute Rehabilitation Services   05/31/2021, 11:00 AM

## 2021-05-31 NOTE — Progress Notes (Signed)
PT Cancellation Note  Patient Details Name: Anthony Ward MRN: [id number] DOB: 1986-10-12   Cancelled Treatment:    Reason Eval/Treat Not Completed: Patient at procedure or test/unavailable. Pt at TEE in AM and now in [place]. Will check back as time allows.   Lyanne Co, PT  Acute Rehab Services  Pager [phone] Office [phone]    Lawana Chambers Chudney Scheffler 05/31/2021, 3:44 PM
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
## 2021-05-31 NOTE — Progress Notes (Signed)
PROGRESS NOTE    Anthony Ward  RUE:[id number] DOB: 11-Feb-1986 DOA: 05/29/2021 PCP: Pcp, No   Chief Complain:Confusion  Brief Narrative: Patient is a 35 year old male with history of drug abuse who was brought here for the evaluation of confusion.  On presentation he was mildly febrile.  Lab work showed elevated liver enzymes, elevated troponin.  CT head showed left-sided focal hemorrhages.    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 There is also concern for possible meningitis/encephalitis for which lumbar puncture has been done and is nonreassuring.  Patient had been started on empiric antibiotics,now D/Ced.  Cardiology also following for elevated troponin, plan for TEE.  Assessment & Plan:   Principal Problem:   Acute encephalopathy Active Problems:   Elevated LFTs   ARF (acute renal failure) (HCC)   Cerebral thrombosis with cerebral infarction  Left MCA stroke: Suspicion for left sided embolic stroke secondary to large vessel disease.  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 Neurology following.  Echo showed EF of 55 to 60%, no intracardiac source of emboli.  Plan for TEE. Carotid Doppler did not show any significant findings. LDL of 123.HbA1c of 5.6.  Statins not started due to elevated liver enzymes. PT/OT/speech evaluation pending Has minimal focal neurodeficits, he has slight weakness on the right lower extremity.  He is completely alert and oriented today.  Suspicion for meningitis/encephalitis: Suspected on admission.  Underwent lumbar puncture.  White cell count of just 1.  We  discontinued  antibiotics.  CSF gram stain did not show any organism.  Total protein is within normal limit.  Aspiration pneumonia: CT chest on presentation showed  patchy areas of consolidation, likely from aspiration secondary to altered mental status.  Currently his lungs are clear and he does not have any signs of pneumonia.  Antibiotics discontinued.  Elevated troponin: Denies any chest pain.  Cardiology following.  Plan for TEE.  Troponins trended down  Elevated liver enzymes: Hepatitis panel negative.  Patient has history of drug abus  Also history of alcohol abuse.  Liver enzymes are improving.  We will check ultrasound of the liver / liver Doppler.  Hypertension: Not on home medications.  Continue to monitor blood pressure.  Continue as needed meds  for severe hypertension.  Drug abuse: History of drug abuse, recently relapsed.  UDS positive for cocaine, THC.           DVT prophylaxis:SCD Code Status: Full Family Communication: Brother at bedside on 7/25 Status is: Inpatient  Remains inpatient appropriate because:Unsafe d/c plan  Dispo: The patient is from: Home              Anticipated d/c is to: Home              Patient currently is not medically stable to d/c.   Difficult to place patient No     Consultants: Neurology, cardiology  Procedures: LP  Antimicrobials:  Anti-infectives (From admission, onward)    Start     Dose/Rate Route Frequency Ordered Stop   05/30/21 2130  acyclovir (ZOVIRAX) 815 mg in dextrose 5 % 150 mL IVPB        10 mg/kg  81.6 kg 166.3 mL/hr over 60 Minutes Intravenous Every 8 hours 05/30/21 1329     05/30/21 1600  acyclovir (ZOVIRAX) 750 mg in dextrose 5 % 150 mL IVPB  Status:  Discontinued        750 mg 165 mL/hr over 60 Minutes Intravenous Every 12 hours 05/30/21 0158 05/30/21 1043   05/30/21 1300  acyclovir (ZOVIRAX) 815 mg in dextrose 5 % 150 mL IVPB  Status:  Discontinued        10 mg/kg  81.6 kg 166.3 mL/hr over 60 Minutes Intravenous Every 8 hours 05/30/21 1043 05/30/21 1329   05/30/21 1100  vancomycin (VANCOREADY) IVPB 750 mg/150 mL        750 mg 150 mL/hr over 60 Minutes Intravenous  Every 12 hours 05/29/21 2114     05/30/21 0800  cefTRIAXone (ROCEPHIN) 2 g in sodium chloride 0.9 % 100 mL IVPB        2 g 200 mL/hr over 30 Minutes Intravenous Every 12 hours 05/30/21 0147     05/30/21 0200  ampicillin (OMNIPEN) 2 g in sodium chloride 0.9 % 100 mL IVPB        2 g 300 mL/hr over 20 Minutes Intravenous Every 6 hours 05/30/21 0147     05/30/21 0200  acyclovir (ZOVIRAX) 750 mg in dextrose 5 % 150 mL IVPB        750 mg 165 mL/hr over 60 Minutes Intravenous Once 05/30/21 0158 05/30/21 0606   05/29/21 2130  vancomycin (VANCOREADY) IVPB 500 mg/100 mL       See Hyperspace for full Linked Orders Report.   500 mg 100 mL/hr over 60 Minutes Intravenous  Once 05/29/21 2018 05/30/21 0423   05/29/21 2030  vancomycin (VANCOCIN) IVPB 1000 mg/200 mL premix       See Hyperspace for full Linked Orders Report.   1,000 mg 200 mL/hr over 60 Minutes Intravenous  Once 05/29/21 2018 05/29/21 2224   05/29/21 2015  cefTRIAXone (ROCEPHIN) 2 g in sodium chloride 0.9 % 100 mL IVPB        2 g 200 mL/hr over 30 Minutes Intravenous  Once 05/29/21 2006 05/29/21 2038       Subjective: Patient seen and examined the bedside this morning.  Looks much better than yesterday.  Currently on room air.  Completely alert and oriented.  Denies any new complaints.   Objective: Vitals:   05/30/21 2013 05/31/21 0001 05/31/21 0340 05/31/21 0500  BP: (!) 147/93 137/88 (!) 154/100   Pulse: 86 81 73   Resp: 20 16 18    Temp: 98.1 F (36.7 C) 98.3 F (36.8 C) 98 F (36.7 C)   TempSrc: Oral Oral Oral   SpO2: 96% 96% 97%   Weight:    82 kg  Height:        Intake/Output Summary (Last 24 hours) at 05/31/2021 0755 Last data filed at 05/31/2021 [id number] Gross per 24 hour  Intake 2844.16 ml  Output 4600 ml  Net -1755.84 ml   Filed Weights   05/29/21 2044 05/31/21 0500  Weight: 81.6 kg 82 kg    Examination:  General exam: Overall comfortable, not in distress HEENT: PERRL Respiratory system:  no wheezes or  crackles  Cardiovascular system: S1 & S2 heard, RRR.  Gastrointestinal system: Abdomen is nondistended, soft and nontender. Central nervous system: Alert and oriented Extremities: No edema, no clubbing ,no cyanosis, mild weakness of the right lower extremity Skin: No rashes, no ulcers,no icterus  ,tatoos    Data Reviewed: I have personally reviewed following labs and imaging studies  CBC: Recent Labs  Lab 05/29/21 1835 05/29/21 1901 05/29/21 1902 05/30/21 0306 05/31/21 0105  WBC 9.4  --   --  8.7 10.0  NEUTROABS 7.9*  --   --  7.7 7.9*  HGB 15.4 18.4* 18.7* 15.2 14.1  HCT 43.9 54.0* 55.0* 43.8 39.8  MCV 85.4  --   --  87.4 85.4  PLT 225  --   --  187 209   Basic Metabolic Panel: Recent Labs  Lab 05/29/21 1835 05/29/21 1901 05/29/21 1902 05/30/21 0306 05/31/21 0105  NA 131* 132* 132* 133* 136  K 4.9 5.2* 5.3* 4.6 4.1  CL 91* 94*  --  99 102  CO2 28  --   --  23 28  GLUCOSE 172* 164*  --  128* 141*  BUN 28* 28*  --  19 13  CREATININE 2.10* 2.20*  --  1.43* 1.00  CALCIUM 7.8*  --   --  7.9* 7.8*   GFR: Estimated Creatinine Clearance: 104.1 mL/min (by C-G formula based on SCr of 1 mg/dL). Liver Function Tests: Recent Labs  Lab 05/29/21 1835 05/30/21 0306 05/31/21 0105  AST 1,462*  1,503* 1,564* 986*  ALT 731*  728* 730* 600*  ALKPHOS 63  62 49 42  BILITOT 1.6*  1.4* 1.1 0.9  PROT 7.7  7.6 6.3* 5.1*  ALBUMIN 4.4  4.3 3.4* 2.7*   Recent Labs  Lab 05/29/21 1835  LIPASE 27   Recent Labs  Lab 05/29/21 1849  AMMONIA 30   Coagulation Profile: Recent Labs  Lab 05/29/21 1835 05/30/21 0306  INR 1.0  1.0 1.1   Cardiac Enzymes: No results for input(s): CKTOTAL, CKMB, CKMBINDEX, TROPONINI in the last 168 hours. BNP (last 3 results) No results for input(s): PROBNP in the last 8760 hours. HbA1C: Recent Labs    05/30/21 0306  HGBA1C 5.6   CBG: Recent Labs  Lab 05/30/21 0604 05/30/21 1258 05/31/21 0001 05/31/21 0608  GLUCAP 141* 125* 139*  149*   Lipid Profile: Recent Labs    05/30/21 1420  CHOL 182  HDL 51  LDLCALC 123*  TRIG 42  CHOLHDL 3.6   Thyroid Function Tests: No results for input(s): TSH, T4TOTAL, FREET4, T3FREE, THYROIDAB in the last 72 hours. Anemia Panel: No results for input(s): VITAMINB12, FOLATE, FERRITIN, TIBC, IRON, RETICCTPCT in the last 72 hours. Sepsis Labs: Recent Labs  Lab 05/29/21 1850 05/29/21 2035 05/30/21 0339  LATICACIDVEN 2.9* 2.0* 1.1    Recent Results (from the past 240 hour(s))  Culture, blood (routine x 2)     Status: None (Preliminary result)   Collection Time: 05/29/21  6:35 PM   Specimen: Left Antecubital; Blood  Result Value Ref Range Status   Specimen Description   Final    LEFT ANTECUBITAL BLOOD Performed at [facility], [address] [place], [place] [address]    Special Requests   Final    BOTTLES DRAWN AEROBIC AND ANAEROBIC Blood Culture adequate volume Performed at [facility], [address] [place], [place] [address]    Culture   Final    NO GROWTH < 12 HOURS Performed at [facility], [address], [place], [place] [address]    Report Status PENDING  Incomplete  Culture, blood (routine x 2)     Status: None (Preliminary result)   Collection Time: 05/29/21  6:35 PM   Specimen: BLOOD RIGHT HAND  Result Value Ref Range Status   Specimen Description   Final    BLOOD RIGHT HAND Performed at [facility], [address], [place], [place] [address]    Special  Requests   Final    BOTTLES DRAWN AEROBIC AND ANAEROBIC Blood Culture results may not be optimal due to an excessive volume of blood received in culture bottles Performed at [facility], [address] [place], [place] [address]    Culture   Final    NO GROWTH < 12 HOURS Performed at [facility], [address] [place], [place] [address]    Report Status PENDING  Incomplete  Resp Panel by RT-PCR (Flu A&B, Covid) Nasopharyngeal Swab      Status: None   Collection Time: 05/29/21  8:32 PM   Specimen: Nasopharyngeal Swab; Nasopharyngeal(NP) swabs in vial transport medium  Result Value Ref Range Status   SARS Coronavirus 2 by RT PCR NEGATIVE NEGATIVE Final    Comment: (NOTE) SARS-CoV-2 target nucleic acids are NOT DETECTED.  The SARS-CoV-2 RNA is generally detectable in upper respiratory specimens during the acute phase of infection. The lowest concentration of SARS-CoV-2 viral copies this assay can detect is 138 copies/mL. A negative result does not preclude SARS-Cov-2 infection and should not be used as the sole basis for treatment or other patient management decisions. A negative result may occur with  improper specimen collection/handling, submission of specimen other than nasopharyngeal swab, presence of viral mutation(s) within the areas targeted by this assay, and inadequate number of viral copies(<138 copies/mL). A negative result must be combined with clinical observations, patient history, and epidemiological information. The expected result is Negative.  Fact Sheet for Patients:  BloggerCourse.com  Fact Sheet for Healthcare Providers:  SeriousBroker.it  This test is no t yet approved or cleared by the [place] FDA and  has been authorized for detection and/or diagnosis of SARS-CoV-2 by FDA under an Emergency Use Authorization (EUA). This EUA will remain  in effect (meaning this test can be used) for the duration of the COVID-19 declaration under Section 564(b)(1) of the Act, 21 U.S.C.section 360bbb-3(b)(1), unless the authorization is terminated  or revoked sooner.       Influenza A by PCR NEGATIVE NEGATIVE Final   Influenza B by PCR NEGATIVE NEGATIVE Final    Comment: (NOTE) The Xpert Xpress SARS-CoV-2/FLU/RSV plus assay is intended as an aid in the diagnosis of influenza from Nasopharyngeal swab specimens and should not be used as a sole basis  for treatment. Nasal washings and aspirates are unacceptable for Xpert Xpress SARS-CoV-2/FLU/RSV testing.  Fact Sheet for Patients: BloggerCourse.com  Fact Sheet for Healthcare Providers: SeriousBroker.it  This test is not yet approved or cleared by the [place] FDA and has been authorized for detection and/or diagnosis of SARS-CoV-2 by FDA under an Emergency Use Authorization (EUA). This EUA will remain in effect (meaning this test can be used) for the duration of the COVID-19 declaration under Section 564(b)(1) of the Act, 21 U.S.C. section 360bbb-3(b)(1), unless the authorization is terminated or revoked.  Performed at [facility], [address] [place], [place] [address]   CSF culture w Stat Gram Stain     Status: None (Preliminary result)   Collection Time: 05/30/21  7:41 AM   Specimen: CSF; Cerebrospinal Fluid  Result Value Ref Range Status   Specimen Description CSF  Final   Special Requests NONE  Final   Gram Stain   Final    WBC PRESENT, PREDOMINANTLY MONONUCLEAR NO ORGANISMS SEEN CYTOSPIN SMEAR Performed at [facility], [address] [place], [place] [address]    Culture PENDING  Incomplete   Report Status  PENDING  Incomplete         Radiology Studies: CT Head Wo Contrast  Result Date: 05/29/2021 CLINICAL DATA:  Altered mental status. EXAM: CT HEAD WITHOUT CONTRAST TECHNIQUE: Contiguous axial images were obtained from the base of the skull through the vertex without intravenous contrast. COMPARISON:  None. FINDINGS: Brain: A cluster of 4 mm and 5 mm hyperdense foci are seen within the white matter of the left parietal lobe. These are near the anterior aspect of the left external capsule and adjacent to the head of the caudate nucleus on the left (axial CT images 19 through 21, CT series 2). A very small amount of surrounding white matter low attenuation is seen without evidence of  significant mass effect or midline shift. No evidence of acute infarction, hydrocephalus, extra-axial collection or mass lesion/mass effect. Vascular: No hyperdense vessel or unexpected calcification. Skull: Normal. Negative for fracture or focal lesion. Sinuses/Orbits: No acute finding. Other: None. IMPRESSION: 1. Cluster of very small areas of focal hemorrhage within the white matter of the left parietal lobe, as described above. MRI correlation is recommended. Electronically Signed   By: Aram Candela M.D.   On: 05/29/2021 19:48   CT Cervical Spine Wo Contrast  Result Date: 05/29/2021 CLINICAL DATA:  Status post trauma. EXAM: CT CERVICAL SPINE WITHOUT CONTRAST TECHNIQUE: Multidetector CT imaging of the cervical spine was performed without intravenous contrast. Multiplanar CT image reconstructions were also generated. COMPARISON:  None. FINDINGS: Alignment: Normal. Skull base and vertebrae: No acute fracture. No primary bone lesion or focal pathologic process. Soft tissues and spinal canal: No prevertebral fluid or swelling. No visible canal hematoma. Disc levels: Normal multilevel endplates are seen with normal multilevel intervertebral disc spaces. Normal, bilateral multilevel facet joints are noted. Upper chest: Negative. Other: None. IMPRESSION: Normal cervical spine CT. Electronically Signed   By: Aram Candela M.D.   On: 05/29/2021 19:50   MR ANGIO HEAD WO CONTRAST  Result Date: 05/30/2021 CLINICAL DATA:  Headache, intracranial hemorrhage suspected; Neuro deficit, acute, stroke suspected. EXAM: MRI HEAD WITH CONTRAST MRA HEAD WITHOUT CONTRAST TECHNIQUE: Multiplanar, multiecho pulse sequences of the brain and surrounding structures were obtained with intravenous contrast. Angiographic images of the head were obtained using MRA technique without contrast. CONTRAST:  7.106mL GADAVIST GADOBUTROL 1 MMOL/ML IV SOLN COMPARISON:  Noncontrast head MRI 05/30/2021 and head CT 05/29/2021 FINDINGS: MRI HEAD  FINDINGS Minimal enhancement in the regions of the left basal ganglia infarcts and hemorrhage shown on the prior noncontrast MRI is vascular in appearance. No masslike or other suspicious enhancement is identified. MRA HEAD FINDINGS The included intracranial portions of the vertebral arteries are widely patent to the basilar and codominant. Patent PICA, AICA, and SCA origins are identified bilaterally with a common origin of the left PICA and AICA noted, a normal variant. The basilar artery is widely patent. There is a large left posterior communicating artery. Both PCAs are patent without evidence of a significant proximal stenosis. The internal carotid arteries are widely patent from skull base to carotid termini. The left MCA is occluded at its origin with some surrounding collateral vessels but only at most faintly reconstituted distal flow. The right MCA and both ACAs are patent without evidence of a significant proximal right MCA or left ACA stenosis. The right A1 segment is hypoplastic with a moderate to severe stenosis proximally. No aneurysm is identified. IMPRESSION: 1. Left MCA occlusion. 2. Hypoplastic right A1 segment with moderate to severe proximal stenosis. 3. No masslike or suspicious intracranial  enhancement. Electronically Signed   By: Sebastian Ache M.D.   On: 05/30/2021 10:53   MR BRAIN WO CONTRAST  Result Date: 05/30/2021 CLINICAL DATA:  Mental status change, unknown cause Bleed, vs infection vs mass EXAM: MRI HEAD WITHOUT CONTRAST TECHNIQUE: Multiplanar, multiecho pulse sequences of the brain and surrounding structures were obtained without intravenous contrast. COMPARISON:  None. FINDINGS: Brain: Multifocal hemorrhage in the left basal ganglia with surrounding edema. There are areas of diffusion restriction in the inferior left lentiform nucleus, left caudate head and left insula. No acute or chronic hemorrhage. Normal white matter signal, parenchymal volume and CSF spaces. The midline  structures are normal. Vascular: Major flow voids are preserved. Skull and upper cervical spine: Normal calvarium and skull base. Visualized upper cervical spine and soft tissues are normal. Sinuses/Orbits:No paranasal sinus fluid levels or advanced mucosal thickening. No mastoid or middle ear effusion. Normal orbits. IMPRESSION: 1. Multifocal hemorrhage and abnormal diffusion restriction in the left hemisphere, within the basal ganglia and insula. Possibilities include hypertensive type hemorrhage (possibly secondary to cocaine abuse in this case) or an infectious process. Further MR imaging with contrast is recommended. Electronically Signed   By: Deatra Robinson M.D.   On: 05/30/2021 00:44   MR BRAIN W CONTRAST  Result Date: 05/30/2021 CLINICAL DATA:  Headache, intracranial hemorrhage suspected; Neuro deficit, acute, stroke suspected. EXAM: MRI HEAD WITH CONTRAST MRA HEAD WITHOUT CONTRAST TECHNIQUE: Multiplanar, multiecho pulse sequences of the brain and surrounding structures were obtained with intravenous contrast. Angiographic images of the head were obtained using MRA technique without contrast. CONTRAST:  7.67mL GADAVIST GADOBUTROL 1 MMOL/ML IV SOLN COMPARISON:  Noncontrast head MRI 05/30/2021 and head CT 05/29/2021 FINDINGS: MRI HEAD FINDINGS Minimal enhancement in the regions of the left basal ganglia infarcts and hemorrhage shown on the prior noncontrast MRI is vascular in appearance. No masslike or other suspicious enhancement is identified. MRA HEAD FINDINGS The included intracranial portions of the vertebral arteries are widely patent to the basilar and codominant. Patent PICA, AICA, and SCA origins are identified bilaterally with a common origin of the left PICA and AICA noted, a normal variant. The basilar artery is widely patent. There is a large left posterior communicating artery. Both PCAs are patent without evidence of a significant proximal stenosis. The internal carotid arteries are widely  patent from skull base to carotid termini. The left MCA is occluded at its origin with some surrounding collateral vessels but only at most faintly reconstituted distal flow. The right MCA and both ACAs are patent without evidence of a significant proximal right MCA or left ACA stenosis. The right A1 segment is hypoplastic with a moderate to severe stenosis proximally. No aneurysm is identified. IMPRESSION: 1. Left MCA occlusion. 2. Hypoplastic right A1 segment with moderate to severe proximal stenosis. 3. No masslike or suspicious intracranial enhancement. Electronically Signed   By: Sebastian Ache M.D.   On: 05/30/2021 10:53   CT CHEST ABDOMEN PELVIS W CONTRAST  Result Date: 05/29/2021 CLINICAL DATA:  Altered mental status EXAM: CT CHEST, ABDOMEN, AND PELVIS WITH CONTRAST TECHNIQUE: Multidetector CT imaging of the chest, abdomen and pelvis was performed following the standard protocol during bolus administration of intravenous contrast. CONTRAST:  80mL OMNIPAQUE IOHEXOL 300 MG/ML  SOLN COMPARISON:  None. FINDINGS: CT CHEST FINDINGS Cardiovascular: The aortic root is suboptimally assessed given cardiac pulsation artifact. The aorta is normal caliber. No acute luminal abnormality of the imaged aorta. No periaortic stranding or hemorrhage. Normal 3 vessel branching of the aortic arch.  Proximal great vessels are unremarkable. Normal heart size. No pericardial effusion. Central pulmonary arteries are normal caliber. No large central filling defects. More distal evaluation limited by a non tailored technique. No major venous abnormalities. Mediastinum/Nodes: No mediastinal fluid or gas. Normal thyroid gland and thoracic inlet. No acute abnormality of the trachea. Mild thickening of the distal thoracic esophagus. No worrisome mediastinal, hilar or axillary adenopathy. Lungs/Pleura: Patchy areas of mixed ground-glass and consolidative opacity are present in the right upper lobe and minimally in the bilateral lower lobes  with additional hypoventilatory changes/atelectasis. No pneumothorax. No layering effusion. No concerning pulmonary nodules or masses. Musculoskeletal: No acute osseous abnormality or suspicious osseous lesion. No body wall hematoma, chest wall mass or other concerning abnormality. CT ABDOMEN PELVIS FINDINGS Hepatobiliary: No direct hepatic injury or perihepatic hematoma. Diffuse hepatic hypoattenuation compatible with hepatic steatosis. Sparing along the gallbladder fossa. No concerning focal liver lesion. Smooth liver surface contour. Pancreas: No pancreatic ductal dilatation or surrounding inflammatory changes. Spleen: Normal in size. No concerning splenic lesions. Small accessory splenule anteriorly. Adrenals/Urinary Tract: No concerning adrenal mass or hemorrhage. Kidneys are normally located with symmetric enhancementand excretion without extravasation of contrast on the excretory delayed phase imaging. No direct renal injury or perinephric hemorrhage. No suspicious renal lesion, urolithiasis or hydronephrosis. No evidence of acute bladder injury or other bladder abnormality. Stomach/Bowel: Distal esophagus, stomach and duodenal sweep are unremarkable. No small bowel wall thickening or dilatation. No evidence of obstruction. A normal appendix is visualized. No colonic dilatation or wall thickening. No mesenteric hematoma or contusion. Vascular/Lymphatic: No significant vascular findings are present. No enlarged abdominal or pelvic lymph nodes. Reproductive: The prostate and seminal vesicles are unremarkable. No acute or worrisome abnormality of the external genitalia. Other: No body wall or retroperitoneal hematoma. No free abdominopelvic air or fluid. No traumatic abdominal wall dehiscence. No bowel containing hernia. Musculoskeletal: No acute osseous abnormality or suspicious osseous lesion. Thirteen pairs of ribs, the lowest of which appear rudimentary. Four normally formed lumbar vertebrae with a fifth  transitional, partially sacralized vertebrae. IMPRESSION: No acute traumatic findings in the chest, abdomen or pelvis. Patchy areas of consolidation and ground-glass seen in the upper lobes and bilateral lower lobes, could reflect an acute infectious or inflammatory process or possible sequela of aspiration in the setting of altered mental status. Mild circumferential thickening of the distal thoracic esophagus, can be seen in the setting of emesis or reflux. Correlate with clinical findings. Hepatic steatosis. Electronically Signed   By: Kreg Shropshire M.D.   On: 05/29/2021 19:46   ECHOCARDIOGRAM COMPLETE  Result Date: 05/30/2021    ECHOCARDIOGRAM REPORT   Patient Name:   EHAB HUMBER Date of Exam: 05/30/2021 Medical Rec #:  [id number]      Height:       69.0 in Accession #:    [id number]     Weight:       180.0 lb Date of Birth:  [birth year]     BSA:          1.976 m Patient Age:    34 years       BP:           142/105 mmHg Patient Gender: M              HR:           91 bpm. Exam Location:  Inpatient Procedure: 2D Echo, Cardiac Doppler and Color Doppler Indications:    Elevated Troponin  History:  Patient has no prior history of Echocardiogram examinations.  Sonographer:    Elmarie Shiley Dance Referring Phys: [id number] ARSHAD N KAKRAKANDY IMPRESSIONS  1. Left ventricular ejection fraction, by estimation, is 55 to 60%. The left ventricle has normal function. The left ventricle has no regional wall motion abnormalities. Left ventricular diastolic parameters were normal.  2. Right ventricular systolic function is normal. The right ventricular size is normal.  3. The mitral valve is normal in structure. Trivial mitral valve regurgitation.  4. The aortic valve is tricuspid. Aortic valve regurgitation is not visualized. No aortic stenosis is present. FINDINGS  Left Ventricle: Left ventricular ejection fraction, by estimation, is 55 to 60%. The left ventricle has normal function. The left ventricle has no regional wall  motion abnormalities. The left ventricular internal cavity size was normal in size. There is  no left ventricular hypertrophy. Left ventricular diastolic parameters were normal. Normal left ventricular filling pressure. Right Ventricle: The right ventricular size is normal. No increase in right ventricular wall thickness. Right ventricular systolic function is normal. Left Atrium: Left atrial size was normal in size. Right Atrium: Right atrial size was normal in size. Pericardium: There is no evidence of pericardial effusion. Mitral Valve: The mitral valve is normal in structure. Trivial mitral valve regurgitation. Tricuspid Valve: The tricuspid valve is normal in structure. Tricuspid valve regurgitation is trivial. Aortic Valve: The aortic valve is tricuspid. Aortic valve regurgitation is not visualized. No aortic stenosis is present. Pulmonic Valve: The pulmonic valve was normal in structure. Pulmonic valve regurgitation is not visualized. Aorta: The aortic root and ascending aorta are structurally normal, with no evidence of dilitation. IAS/Shunts: No atrial level shunt detected by color flow Doppler.  LEFT VENTRICLE PLAX 2D LVIDd:         4.70 cm  Diastology LVIDs:         2.80 cm  LV e' medial:    9.68 cm/s LV PW:         0.80 cm  LV E/e' medial:  8.9 LV IVS:        1.00 cm  LV e' lateral:   14.90 cm/s LVOT diam:     2.10 cm  LV E/e' lateral: 5.8 LV SV:         64 LV SV Index:   32 LVOT Area:     3.46 cm  RIGHT VENTRICLE             IVC RV Basal diam:  2.60 cm     IVC diam: 1.55 cm RV S prime:     17.40 cm/s TAPSE (M-mode): 2.1 cm LEFT ATRIUM             Index       RIGHT ATRIUM           Index LA diam:        3.20 cm 1.62 cm/m  RA Area:     12.30 cm LA Vol (A2C):   45.8 ml 23.18 ml/m RA Volume:   24.70 ml  12.50 ml/m LA Vol (A4C):   20.9 ml 10.58 ml/m LA Biplane Vol: 32.3 ml 16.35 ml/m  AORTIC VALVE LVOT Vmax:   95.10 cm/s LVOT Vmean:  70.200 cm/s LVOT VTI:    0.185 m  AORTA Ao Root diam: 2.80 cm Ao Asc  diam:  3.50 cm MITRAL VALVE MV Area (PHT): 2.73 cm    SHUNTS MV Decel Time: 278 msec    Systemic VTI:  0.18 m MV E velocity: 85.70 cm/s  Systemic Diam: 2.10 cm MV A velocity: 96.70 cm/s MV E/A ratio:  0.89 Mihai Croitoru MD Electronically signed by Thurmon Fair MD Signature Date/Time: 05/30/2021/2:18:38 PM    Final    VAS US CAROTID  Result Date: 05/30/2021 Carotid Arterial Duplex Study Patient Name:  DMARION PERFECT  Date of Exam:   05/30/2021 Medical Rec #: [id number]       Accession #:    [id number] Date of Birth: 07-11-1986      Patient Gender: M Patient Age:   34Y Exam Location:  [facility] Procedure:      VAS US CAROTID Referring Phys: [id number] [facility] --------------------------------------------------------------------------------  Indications:       CVA. Comparison Study:  No prior studies. Performing Technologist: Jean Rosenthal RDMS,RVT  Examination Guidelines: A complete evaluation includes B-mode imaging, spectral Doppler, color Doppler, and power Doppler as needed of all accessible portions of each vessel. Bilateral testing is considered an integral part of a complete examination. Limited examinations for reoccurring indications may be performed as noted.  Right Carotid Findings: +----------+--------+--------+--------+------------------+------------------+           PSV cm/sEDV cm/sStenosisPlaque DescriptionComments           +----------+--------+--------+--------+------------------+------------------+ CCA Prox  97      19                                                   +----------+--------+--------+--------+------------------+------------------+ CCA Distal80      26                                                   +----------+--------+--------+--------+------------------+------------------+ ICA Prox  52      20                                intimal thickening +----------+--------+--------+--------+------------------+------------------+ ICA Distal64       37                                                   +----------+--------+--------+--------+------------------+------------------+ ECA       93      29                                                   +----------+--------+--------+--------+------------------+------------------+ +----------+--------+-------+----------------+-------------------+           PSV cm/sEDV cmsDescribe        Arm Pressure (mmHG) +----------+--------+-------+----------------+-------------------+ [id number]            Multiphasic, WNL                    +----------+--------+-------+----------------+-------------------+ +---------+--------+--+--------+--+---------+ VertebralPSV cm/s49EDV cm/s25Antegrade +---------+--------+--+--------+--+---------+  Left Carotid Findings: +----------+--------+--------+--------+------------------+------------------+           PSV cm/sEDV cm/sStenosisPlaque DescriptionComments           +----------+--------+--------+--------+------------------+------------------+ CCA Prox  83      23                                                   +----------+--------+--------+--------+------------------+------------------+  CCA Distal84      32                                                   +----------+--------+--------+--------+------------------+------------------+ ICA Prox  37      12                                intimal thickening +----------+--------+--------+--------+------------------+------------------+ ICA Distal80      42                                                   +----------+--------+--------+--------+------------------+------------------+ ECA       101     27                                                   +----------+--------+--------+--------+------------------+------------------+ +----------+--------+--------+----------------+-------------------+           PSV cm/sEDV cm/sDescribe        Arm Pressure (mmHG)  +----------+--------+--------+----------------+-------------------+ Subclavian203             Multiphasic, WNL                    +----------+--------+--------+----------------+-------------------+ +---------+--------+--+--------+--+---------+ VertebralPSV cm/s36EDV cm/s17Antegrade +---------+--------+--+--------+--+---------+   Summary: Right Carotid: The extracranial vessels were near-normal with only minimal wall                thickening or plaque. Left Carotid: The extracranial vessels were near-normal with only minimal wall               thickening or plaque. Vertebrals:  Bilateral vertebral arteries demonstrate antegrade flow. Subclavians: Normal flow hemodynamics were seen in bilateral subclavian              arteries. *See table(s) above for measurements and observations.     Preliminary    CT Maxillofacial Wo Contrast  Result Date: 05/29/2021 CLINICAL DATA:  Facial trauma. EXAM: CT MAXILLOFACIAL WITHOUT CONTRAST TECHNIQUE: Multidetector CT imaging of the maxillofacial structures was performed. Multiplanar CT image reconstructions were also generated. COMPARISON:  None. FINDINGS: Osseous: No fracture or mandibular dislocation. No destructive process. Orbits: Negative. No traumatic or inflammatory finding. Sinuses: Very mild anterior left ethmoid sinus mucosal thickening is seen. Soft tissues: Negative. Limited intracranial: No significant or unexpected finding. IMPRESSION: No acute osseous abnormality. Electronically Signed   By: Aram Candela M.D.   On: 05/29/2021 19:52        Scheduled Meds:  Chlorhexidine Gluconate Cloth  6 each Topical Daily   lidocaine (PF)  5 mL Infiltration Once   Continuous Infusions:  sodium chloride 150 mL/hr at 05/31/21 0337   [START ON 06/01/2021] sodium chloride     acyclovir 815 mg (05/31/21 0513)   ampicillin (OMNIPEN) IV 2 g (05/31/21 0603)   cefTRIAXone (ROCEPHIN)  IV Stopped (05/31/21 0451)   vancomycin 750 mg (05/30/21 2322)     LOS:  1 day    Time spent: 25 mins.More than 50% of that time was spent in counseling and/or coordination of  care.      Burnadette Pop, MD [facility] P7/26/2022, 7:55 AM

## 2021-05-31 NOTE — Progress Notes (Signed)
Venous duplex lower ext  has been completed. Refer to [facility] under chart review to view preliminary results.   05/31/2021  11:00 AM Jnyah Brazee, Gerarda Gunther

## 2021-06-01 ENCOUNTER — Inpatient Hospital Stay (HOSPITAL_COMMUNITY): Payer: BLUE CROSS/BLUE SHIELD | Admitting: Certified Registered Nurse Anesthetist

## 2021-06-01 ENCOUNTER — Inpatient Hospital Stay (HOSPITAL_COMMUNITY): Payer: BLUE CROSS/BLUE SHIELD

## 2021-06-01 ENCOUNTER — Encounter (HOSPITAL_COMMUNITY)
Admission: EM | Disposition: A | Discharge status: Home / Self Care | Payer: Self-pay | Source: Home / Self Care | Attending: Internal Medicine

## 2021-06-01 ENCOUNTER — Encounter (HOSPITAL_COMMUNITY): Payer: Self-pay | Admitting: Internal Medicine

## 2021-06-01 DIAGNOSIS — I340 Nonrheumatic mitral (valve) insufficiency: Secondary | ICD-10-CM

## 2021-06-01 DIAGNOSIS — R748 Abnormal levels of other serum enzymes: Secondary | ICD-10-CM

## 2021-06-01 DIAGNOSIS — K700 Alcoholic fatty liver: Secondary | ICD-10-CM

## 2021-06-01 DIAGNOSIS — I639 Cerebral infarction, unspecified: Secondary | ICD-10-CM

## 2021-06-01 HISTORY — PX: BUBBLE STUDY: SHX6837

## 2021-06-01 HISTORY — PX: TEE WITHOUT CARDIOVERSION: SHX5443

## 2021-06-01 LAB — CK: Total CK: 32193 U/L — ABNORMAL HIGH (ref 49–397)

## 2021-06-01 LAB — IRON AND TIBC
Iron: 53 ug/dL (ref 45–182)
Saturation Ratios: 25 % (ref 17.9–39.5)
TIBC: 210 ug/dL — ABNORMAL LOW (ref 250–450)
UIBC: 157 ug/dL

## 2021-06-01 LAB — COMPREHENSIVE METABOLIC PANEL
ALT: 706 U/L — ABNORMAL HIGH (ref 0–44)
AST: 1120 U/L — ABNORMAL HIGH (ref 15–41)
Albumin: 3 g/dL — ABNORMAL LOW (ref 3.5–5.0)
Alkaline Phosphatase: 44 U/L (ref 38–126)
Anion gap: 6 (ref 5–15)
BUN: 12 mg/dL (ref 6–20)
CO2: 28 mmol/L (ref 22–32)
Calcium: 8.6 mg/dL — ABNORMAL LOW (ref 8.9–10.3)
Chloride: 103 mmol/L (ref 98–111)
Creatinine, Ser: 1.09 mg/dL (ref 0.61–1.24)
GFR, Estimated: >60 mL/min (ref >60)
Glucose, Bld: 113 mg/dL — ABNORMAL HIGH (ref 70–99)
Potassium: 4.2 mmol/L (ref 3.5–5.1)
Sodium: 137 mmol/L (ref 135–145)
Total Bilirubin: 0.9 mg/dL (ref 0.3–1.2)
Total Protein: 5.6 g/dL — ABNORMAL LOW (ref 6.5–8.1)

## 2021-06-01 LAB — FERRITIN: Ferritin: 261 ng/mL (ref 24–336)

## 2021-06-01 LAB — HSV 1/2 PCR, CSF
HSV-1 DNA: NEGATIVE
HSV-2 DNA: NEGATIVE

## 2021-06-01 LAB — GLUCOSE, CAPILLARY
Glucose-Capillary: 121 mg/dL — ABNORMAL HIGH (ref 70–99)
Glucose-Capillary: 106 mg/dL — ABNORMAL HIGH (ref 70–99)

## 2021-06-01 SURGERY — ECHOCARDIOGRAM, TRANSESOPHAGEAL
Anesthesia: General

## 2021-06-01 MED ORDER — PHENYLEPHRINE 40 MCG/ML (10ML) SYRINGE FOR IV PUSH (FOR BLOOD PRESSURE SUPPORT)
PREFILLED_SYRINGE | INTRAVENOUS | Status: DC | PRN
  Administered 2021-06-01: 80 ug via INTRAVENOUS

## 2021-06-01 MED ORDER — PROPOFOL 500 MG/50ML IV EMUL
INTRAVENOUS | Status: DC | PRN
  Administered 2021-06-01: 75 ug/kg/min via INTRAVENOUS

## 2021-06-01 MED ORDER — VANCOMYCIN HCL 1250 MG/250ML IV SOLN
1250.0000 mg | Freq: Two times a day (BID) | INTRAVENOUS | Status: DC
  Administered 2021-06-01 – 2021-06-03 (×4): 1250 mg via INTRAVENOUS
  Filled 2021-06-01 (×5): qty 250

## 2021-06-01 MED ORDER — SODIUM CHLORIDE 0.9 % IV SOLN
2.0000 g | INTRAVENOUS | Status: DC
  Administered 2021-06-01 – 2021-06-03 (×3): 2 g via INTRAVENOUS
  Filled 2021-06-01 (×4): qty 20

## 2021-06-01 MED ORDER — KETOROLAC TROMETHAMINE 15 MG/ML IJ SOLN
15.0000 mg | Freq: Once | INTRAMUSCULAR | Status: AC
  Administered 2021-06-01: 15 mg via INTRAVENOUS
  Filled 2021-06-01: qty 1

## 2021-06-01 MED ORDER — KETOROLAC TROMETHAMINE 15 MG/ML IJ SOLN: 15.0000 mg | Freq: Four times a day (QID) | INTRAMUSCULAR | Status: AC | PRN

## 2021-06-01 MED ORDER — PROPOFOL 10 MG/ML IV BOLUS
INTRAVENOUS | Status: DC | PRN
  Administered 2021-06-01: 20 mg via INTRAVENOUS
  Administered 2021-06-01: 80 mg via INTRAVENOUS

## 2021-06-01 MED ORDER — LACTATED RINGERS IV SOLN: INTRAVENOUS | Status: DC | PRN

## 2021-06-01 NOTE — Transfer of Care (Signed)
Immediate Anesthesia Transfer of Care Note  Patient: Anthony Ward  Procedure(s) Performed: TRANSESOPHAGEAL ECHOCARDIOGRAM (TEE) BUBBLE STUDY  Patient Location: PACU  Anesthesia Type:MAC  Level of Consciousness: drowsy  Airway & Oxygen Therapy: Patient Spontanous Breathing and Patient connected to nasal cannula oxygen  Post-op Assessment: Report given to RN and Post -op Vital signs reviewed and stable  Post vital signs: Reviewed and stable  Last Vitals:  Vitals Value Taken Time  BP 117/78 06/01/21 1500  Temp 36.4 C 06/01/21 1500  Pulse 69 06/01/21 1505  Resp 18 06/01/21 1505  SpO2 97 % 06/01/21 1505  Vitals shown include unvalidated device data.  Last Pain:  Vitals:   06/01/21 1500  TempSrc: Temporal  PainSc: 0-No pain         Complications: No notable events documented.

## 2021-06-01 NOTE — Progress Notes (Signed)
PROGRESS NOTE    Anthony Ward  ZOX:[id number] DOB: 1986/01/02 DOA: 05/29/2021 PCP: Pcp, No   Chief Complain:Confusion  Brief Narrative: Patient is a 35 year old male with history of drug abuse who was brought here for the evaluation of confusion.  On presentation he was mildly febrile.  Lab work showed elevated liver enzymes, elevated troponin.  CT head showed left-sided focal hemorrhages.    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 There is also concern for possible meningitis/encephalitis for which lumbar puncture has been done and is nonreassuring.  Patient had been started on empiric antibiotics,now D/Ced.  Cardiology also following for elevated troponin, plan for TEE today.  GI also consulted for persistent elevation in the liver enzymes  Assessment & Plan:   Principal Problem:   Acute encephalopathy Active Problems:   Elevated LFTs   ARF (acute renal failure) (HCC)   Cerebral thrombosis with cerebral infarction  Left MCA stroke: Suspicion for left sided embolic stroke secondary to large vessel disease.  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 Neurology following.  Echo showed EF of 55 to 60%, no intracardiac source of emboli.  Plan for TEE. Carotid Doppler did not show any significant findings. LDL of 123.HbA1c of 5.6.  Statins not started due to elevated liver enzymes. PT/OT/speech evaluation pending Has minimal focal neurodeficits, he has slight weakness on the right lower extremity.  He is completely alert and oriented today.  Suspicion for meningitis/encephalitis: Suspected on admission.  Underwent lumbar puncture.  White cell count of just 1.  We  discontinued  antibiotics.  CSF gram stain did not show any organism.  Total protein is within  normal limit.  Aspiration pneumonia: CT chest on presentation showed patchy areas of consolidation, likely from aspiration secondary to altered mental status.  Currently his lungs are clear and he does not have any signs of pneumonia.  Antibiotics discontinued.  Elevated troponin: Denies any chest pain.  Cardiology following.  Plan for TEE.  Troponins trended down  Elevated liver enzymes: Hepatitis panel negative. Has history of alcohol abuse.  Liver enzymes did not improve that much continued up today.  US Doppler liver did not show significant abnormality.  GI consulted, autoimmune work-up being sent  Hypertension: Not on home medications.  Continue to monitor blood pressure.  Continue as needed meds  for severe hypertension.  Drug abuse: History of drug abuse, recently relapsed.  UDS positive for cocaine, THC.           DVT prophylaxis:SCD Code Status: Full Family Communication: Discussed with sister on phone on 7/27 Status is: Inpatient  Remains inpatient appropriate because: Ongoing work-up  Dispo: The patient is from: Home              Anticipated d/c is to: Home              Patient currently is not medically stable to d/c.   Difficult to place patient No     Consultants: Neurology, cardiology  Procedures: LP  Antimicrobials:  Anti-infectives (From admission, onward)    Start     Dose/Rate Route Frequency Ordered Stop   05/30/21 2130  acyclovir (ZOVIRAX) 815 mg in dextrose 5 % 150 mL IVPB  Status:  Discontinued        10 mg/kg  81.6 kg 166.3 mL/hr over 60 Minutes Intravenous Every 8  hours 05/30/21 1329 05/31/21 0803   05/30/21 1600  acyclovir (ZOVIRAX) 750 mg in dextrose 5 % 150 mL IVPB  Status:  Discontinued        750 mg 165 mL/hr over 60 Minutes Intravenous Every 12 hours 05/30/21 0158 05/30/21 1043   05/30/21 1300  acyclovir (ZOVIRAX) 815 mg in dextrose 5 % 150 mL IVPB  Status:  Discontinued        10 mg/kg  81.6 kg 166.3 mL/hr over 60 Minutes  Intravenous Every 8 hours 05/30/21 1043 05/30/21 1329   05/30/21 1100  vancomycin (VANCOREADY) IVPB 750 mg/150 mL  Status:  Discontinued        750 mg 150 mL/hr over 60 Minutes Intravenous Every 12 hours 05/29/21 2114 05/31/21 0803   05/30/21 0800  cefTRIAXone (ROCEPHIN) 2 g in sodium chloride 0.9 % 100 mL IVPB  Status:  Discontinued        2 g 200 mL/hr over 30 Minutes Intravenous Every 12 hours 05/30/21 0147 05/31/21 0803   05/30/21 0200  ampicillin (OMNIPEN) 2 g in sodium chloride 0.9 % 100 mL IVPB  Status:  Discontinued        2 g 300 mL/hr over 20 Minutes Intravenous Every 6 hours 05/30/21 0147 05/31/21 0803   05/30/21 0200  acyclovir (ZOVIRAX) 750 mg in dextrose 5 % 150 mL IVPB        750 mg 165 mL/hr over 60 Minutes Intravenous Once 05/30/21 0158 05/30/21 0606   05/29/21 2130  vancomycin (VANCOREADY) IVPB 500 mg/100 mL       See Hyperspace for full Linked Orders Report.   500 mg 100 mL/hr over 60 Minutes Intravenous  Once 05/29/21 2018 05/30/21 0423   05/29/21 2030  vancomycin (VANCOCIN) IVPB 1000 mg/200 mL premix       See Hyperspace for full Linked Orders Report.   1,000 mg 200 mL/hr over 60 Minutes Intravenous  Once 05/29/21 2018 05/29/21 2224   05/29/21 2015  cefTRIAXone (ROCEPHIN) 2 g in sodium chloride 0.9 % 100 mL IVPB        2 g 200 mL/hr over 30 Minutes Intravenous  Once 05/29/21 2006 05/29/21 2038       Subjective: Patient seen and examined at bedside this morning.  Hemodynamically stable.  Comfortable.  Alert and oriented.  Complains of some back pain.  Objective: Vitals:   05/31/21 1923 05/31/21 2352 06/01/21 0306 06/01/21 0831  BP: (!) 143/95 (!) 142/99 (!) 138/93 (!) 136/102  Pulse: 73 (!) 55 65 (!) 56  Resp: 16 16 17 17   Temp: 98.2 F (36.8 C) 98.1 F (36.7 C) 97.8 F (36.6 C) 97.9 F (36.6 C)  TempSrc: Oral Oral Oral Oral  SpO2: 98% 99% 98% 97%  Weight:      Height:        Intake/Output Summary (Last 24 hours) at 06/01/2021 1113 Last data filed  at 05/31/2021 1625 Gross per 24 hour  Intake --  Output 1825 ml  Net -1825 ml   Filed Weights   05/29/21 2044 05/31/21 0500  Weight: 81.6 kg 82 kg    Examination:  General exam: Overall comfortable, not in distress HEENT: PERRL Respiratory system:  no wheezes or crackles  Cardiovascular system: S1 & S2 heard, RRR.  Gastrointestinal system: Abdomen is nondistended, soft and nontender. Central nervous system: Alert and oriented Extremities: No edema, no clubbing ,no cyanosis Skin: No rashes, no ulcers,no icterus  ,tattos    Data Reviewed: I have personally reviewed following labs and  imaging studies  CBC: Recent Labs  Lab 05/29/21 1835 05/29/21 1901 05/29/21 1902 05/30/21 0306 05/31/21 0105  WBC 9.4  --   --  8.7 10.0  NEUTROABS 7.9*  --   --  7.7 7.9*  HGB 15.4 18.4* 18.7* 15.2 14.1  HCT 43.9 54.0* 55.0* 43.8 39.8  MCV 85.4  --   --  87.4 85.4  PLT 225  --   --  187 209   Basic Metabolic Panel: Recent Labs  Lab 05/29/21 1835 05/29/21 1901 05/29/21 1902 05/30/21 0306 05/31/21 0105 06/01/21 0259  NA 131* 132* 132* 133* 136 137  K 4.9 5.2* 5.3* 4.6 4.1 4.2  CL 91* 94*  --  99 102 103  CO2 28  --   --  23 28 28   GLUCOSE 172* 164*  --  128* 141* 113*  BUN 28* 28*  --  19 13 12   CREATININE 2.10* 2.20*  --  1.43* 1.00 1.09  CALCIUM 7.8*  --   --  7.9* 7.8* 8.6*   GFR: Estimated Creatinine Clearance: 95.5 mL/min (by C-G formula based on SCr of 1.09 mg/dL). Liver Function Tests: Recent Labs  Lab 05/29/21 1835 05/30/21 0306 05/31/21 0105 06/01/21 0259  AST 1,462*  1,503* 1,564* 986* 1,120*  ALT 731*  728* 730* 600* 706*  ALKPHOS 63  62 49 42 44  BILITOT 1.6*  1.4* 1.1 0.9 0.9  PROT 7.7  7.6 6.3* 5.1* 5.6*  ALBUMIN 4.4  4.3 3.4* 2.7* 3.0*   Recent Labs  Lab 05/29/21 1835  LIPASE 27   Recent Labs  Lab 05/29/21 1849  AMMONIA 30   Coagulation Profile: Recent Labs  Lab 05/29/21 1835 05/30/21 0306  INR 1.0  1.0 1.1   Cardiac  Enzymes: No results for input(s): CKTOTAL, CKMB, CKMBINDEX, TROPONINI in the last 168 hours. BNP (last 3 results) No results for input(s): PROBNP in the last 8760 hours. HbA1C: Recent Labs    05/30/21 0306  HGBA1C 5.6   CBG: Recent Labs  Lab 05/31/21 0608 05/31/21 1210 05/31/21 1731 05/31/21 2353 06/01/21 0619  GLUCAP 149* 115* 95 147* 121*   Lipid Profile: Recent Labs    05/30/21 1420  CHOL 182  HDL 51  LDLCALC 123*  TRIG 42  CHOLHDL 3.6   Thyroid Function Tests: No results for input(s): TSH, T4TOTAL, FREET4, T3FREE, THYROIDAB in the last 72 hours. Anemia Panel: No results for input(s): VITAMINB12, FOLATE, FERRITIN, TIBC, IRON, RETICCTPCT in the last 72 hours. Sepsis Labs: Recent Labs  Lab 05/29/21 1850 05/29/21 2035 05/30/21 0339  LATICACIDVEN 2.9* 2.0* 1.1    Recent Results (from the past 240 hour(s))  Culture, blood (routine x 2)     Status: None (Preliminary result)   Collection Time: 05/29/21  6:35 PM   Specimen: Left Antecubital; Blood  Result Value Ref Range Status   Specimen Description   Final    LEFT ANTECUBITAL BLOOD Performed at [facility] [place], [address] [place], [place] [address]    Special Requests   Final    BOTTLES DRAWN AEROBIC AND ANAEROBIC Blood Culture adequate volume Performed at [facility] [place], [address] [place], [place] [address]    Culture   Final    NO GROWTH 1 DAY Performed at [facility], [address] [place], [place] [address]    Report Status PENDING  Incomplete  Culture, blood (routine x 2)     Status: None (Preliminary result)   Collection  Time: 05/29/21  6:35 PM   Specimen: BLOOD RIGHT HAND  Result Value Ref Range Status   Specimen Description   Final    BLOOD RIGHT HAND Performed at [facility], [address], [place], [place] [address]    Special Requests   Final    BOTTLES DRAWN AEROBIC AND ANAEROBIC Blood Culture results may not be optimal due to an excessive  volume of blood received in culture bottles Performed at [facility], [address] [place], [place] [address]    Culture   Final    NO GROWTH 1 DAY Performed at [facility], [address] [place], [place] [address]    Report Status PENDING  Incomplete  Resp Panel by RT-PCR (Flu A&B, Covid) Nasopharyngeal Swab     Status: None   Collection Time: 05/29/21  8:32 PM   Specimen: Nasopharyngeal Swab; Nasopharyngeal(NP) swabs in vial transport medium  Result Value Ref Range Status   SARS Coronavirus 2 by RT PCR NEGATIVE NEGATIVE Final    Comment: (NOTE) SARS-CoV-2 target nucleic acids are NOT DETECTED.  The SARS-CoV-2 RNA is generally detectable in upper respiratory specimens during the acute phase of infection. The lowest concentration of SARS-CoV-2 viral copies this assay can detect is 138 copies/mL. A negative result does not preclude SARS-Cov-2 infection and should not be used as the sole basis for treatment or other patient management decisions. A negative result may occur with  improper specimen collection/handling, submission of specimen other than nasopharyngeal swab, presence of viral mutation(s) within the areas targeted by this assay, and inadequate number of viral copies(<138 copies/mL). A negative result must be combined with clinical observations, patient history, and epidemiological information. The expected result is Negative.  Fact Sheet for Patients:  BloggerCourse.com  Fact Sheet for Healthcare Providers:  SeriousBroker.it  This test is no t yet approved or cleared by the [place] FDA and  has been authorized for detection and/or diagnosis of SARS-CoV-2 by FDA under an Emergency Use Authorization (EUA). This EUA will remain  in effect (meaning this test can be used) for the duration of the COVID-19 declaration under Section 564(b)(1) of the Act, 21 U.S.C.section 360bbb-3(b)(1), unless the  authorization is terminated  or revoked sooner.       Influenza A by PCR NEGATIVE NEGATIVE Final   Influenza B by PCR NEGATIVE NEGATIVE Final    Comment: (NOTE) The Xpert Xpress SARS-CoV-2/FLU/RSV plus assay is intended as an aid in the diagnosis of influenza from Nasopharyngeal swab specimens and should not be used as a sole basis for treatment. Nasal washings and aspirates are unacceptable for Xpert Xpress SARS-CoV-2/FLU/RSV testing.  Fact Sheet for Patients: BloggerCourse.com  Fact Sheet for Healthcare Providers: SeriousBroker.it  This test is not yet approved or cleared by the [place] FDA and has been authorized for detection and/or diagnosis of SARS-CoV-2 by FDA under an Emergency Use Authorization (EUA). This EUA will remain in effect (meaning this test can be used) for the duration of the COVID-19 declaration under Section 564(b)(1) of the Act, 21 U.S.C. section 360bbb-3(b)(1), unless the authorization is terminated or revoked.  Performed at [facility], [address] [place], [place] [address]   CSF culture w Stat Gram Stain     Status: None (Preliminary result)   Collection Time: 05/30/21  7:41 AM   Specimen: CSF; Cerebrospinal Fluid  Result Value Ref Range Status   Specimen Description CSF  Final   Special Requests  NONE  Final   Gram Stain   Final    WBC PRESENT, PREDOMINANTLY MONONUCLEAR NO ORGANISMS SEEN CYTOSPIN SMEAR    Culture   Final    NO GROWTH 2 DAYS Performed at [facility], [address] [place], [place] [address]    Report Status PENDING  Incomplete         Radiology Studies: US LIVER DOPPLER  Result Date: 05/31/2021 CLINICAL DATA:  Elevated liver enzymes and history of drug and alcohol abuse. EXAM: DUPLEX ULTRASOUND OF LIVER TECHNIQUE: Color and duplex Doppler ultrasound was performed to evaluate the hepatic in-flow and out-flow vessels. COMPARISON:  None.  FINDINGS: Portal Vein Velocities Main:  27-31 cm/sec Right:  23 cm/sec Left:  19 cm/sec Normally patent portal vein with no evidence of thrombus. Direction of portal vein flow is towards the liver. Portal waveforms are within normal limits. Hepatic Vein Velocities Right:  22 cm/sec Middle:  25 cm/sec Left:  17 cm/sec Normal patent vein waveforms. No evidence of hepatic veno-occlusive disease. Hepatic Artery Velocity:  32 cm/sec Splenic Vein Velocity:  14 cm/sec Varices: None visualized. Ascites: None visualized. The spleen is normal in size. The liver parenchyma is heterogeneous with increased parenchymal echogenicity. Findings are consistent with at least steatosis. IMPRESSION: 1. No evidence of portal vein thrombosis or significant portal hypertension by hepatic duplex ultrasound. 2. Echogenic liver parenchyma consistent with at least steatosis. Electronically Signed   By: Irish Lack M.D.   On: 05/31/2021 16:39   VAS [place] LOWER EXTREMITY VENOUS (DVT)  Result Date: 05/31/2021  Lower Venous DVT Study Patient Name:  BARRET ESQUIVEL  Date of Exam:   05/31/2021 Medical Rec #: [id number]       Accession #:    [id number] Date of Birth: 1986-07-03      Patient Gender: M Patient Age:   66Y Exam Location:  [facility] Procedure:      VAS [place] LOWER EXTREMITY VENOUS (DVT) Referring Phys: [id number] Marvel Plan --------------------------------------------------------------------------------  Indications: Stroke. Other Indications: Embolic stroke, history of current smoker, drug use and                    current alcohol use. Comparison Study: No prior. Performing Technologist: Marilynne Halsted RDMS, RVT  Examination Guidelines: A complete evaluation includes B-mode imaging, spectral Doppler, color Doppler, and power Doppler as needed of all accessible portions of each vessel. Bilateral testing is considered an integral part of a complete examination. Limited examinations for reoccurring indications may be performed as  noted. The reflux portion of the exam is performed with the patient in reverse Trendelenburg.  +---------+---------------+---------+-----------+----------+--------------+ RIGHT    CompressibilityPhasicitySpontaneityPropertiesThrombus Aging +---------+---------------+---------+-----------+----------+--------------+ CFV      Full           Yes      Yes                                 +---------+---------------+---------+-----------+----------+--------------+ SFJ      Full                                                        +---------+---------------+---------+-----------+----------+--------------+ FV Prox  Full                                                        +---------+---------------+---------+-----------+----------+--------------+  FV Mid   Full                                                        +---------+---------------+---------+-----------+----------+--------------+ FV DistalFull                                                        +---------+---------------+---------+-----------+----------+--------------+ PFV      Full                                                        +---------+---------------+---------+-----------+----------+--------------+ POP      Full           Yes      Yes                                 +---------+---------------+---------+-----------+----------+--------------+ PTV      Full                                                        +---------+---------------+---------+-----------+----------+--------------+ PERO     Full                                                        +---------+---------------+---------+-----------+----------+--------------+   +---------+---------------+---------+-----------+----------+--------------+ LEFT     CompressibilityPhasicitySpontaneityPropertiesThrombus Aging +---------+---------------+---------+-----------+----------+--------------+ CFV      Full            Yes      Yes                                 +---------+---------------+---------+-----------+----------+--------------+ SFJ      Full                                                        +---------+---------------+---------+-----------+----------+--------------+ FV Prox  Full                                                        +---------+---------------+---------+-----------+----------+--------------+ FV Mid   Full                                                        +---------+---------------+---------+-----------+----------+--------------+  FV DistalFull                                                        +---------+---------------+---------+-----------+----------+--------------+ PFV      Full                                                        +---------+---------------+---------+-----------+----------+--------------+ POP      Full           Yes      Yes                                 +---------+---------------+---------+-----------+----------+--------------+ PTV      Full                                                        +---------+---------------+---------+-----------+----------+--------------+ PERO     Full                                                        +---------+---------------+---------+-----------+----------+--------------+     Summary: BILATERAL: - No evidence of deep vein thrombosis seen in the lower extremities, bilaterally. -No evidence of popliteal cyst, bilaterally.   *See table(s) above for measurements and observations. Electronically signed by Waverly Ferrari MD on 05/31/2021 at 5:56:49 PM.    Final         Scheduled Meds:  Chlorhexidine Gluconate Cloth  6 each Topical Daily   heparin injection (subcutaneous)  5,000 Units Subcutaneous Q8H   lidocaine (PF)  5 mL Infiltration Once   Continuous Infusions:  sodium chloride Stopped (06/01/21 0655)     LOS: 2 days    Time spent: 25 mins.More than  50% of that time was spent in counseling and/or coordination of care.      Burnadette Pop, MD [facility] P7/27/2022, 11:13 AM

## 2021-06-01 NOTE — Evaluation (Signed)
Physical Therapy Evaluation Patient Details Name: Anthony Ward MRN: [id number] DOB: 07-03-1986 Today's Date: 06/01/2021   History of Present Illness  35 year old male with history of drug abuse and HTN who was brought here for the evaluation of confusion. MRI confirmed left sided hemorrhage .MRA showed Left MCA occlusion.   Clinical Impression  Patient received in bed, reports he is starving. Wants to know when he can eat. Also reporting he is cold. Patient is independent with bed mobility, transfers sit to stand with supervision and ambulated in room with supervision. Does not use AD. Has limp on R LE with gait. Patient reports he is getting around in room by himself. Will benefit from PT follow up to ensure safety with mobility over longer distance. Patient did not want to go further at this time.       Follow Up Recommendations No PT follow up    Equipment Recommendations  None recommended by PT    Recommendations for Other Services       Precautions / Restrictions Precautions Precautions: Fall Precaution Comments: mod fall Restrictions Weight Bearing Restrictions: No      Mobility  Bed Mobility Overal bed mobility: Independent                  Transfers Overall transfer level: Independent   Transfers: Sit to/from [employer] Sit to Stand: Supervision Stand pivot transfers: Supervision       General transfer comment: R sided weakness.  He states that he had R leg weakness prior, but that it is worse than PLOF.  Ambulation/Gait Ambulation/Gait assistance: Supervision Gait Distance (Feet): 25 Feet Assistive device: None Gait Pattern/deviations: Step-through pattern;Decreased stance time - right;Decreased weight shift to right;Antalgic Gait velocity: WFL   General Gait Details: patient ambulates with antalgic gait pattern, limping on right LE. No lob no AD needed  Stairs            Wheelchair Mobility    Modified Rankin (Stroke  Patients Only) Modified Rankin (Stroke Patients Only) Pre-Morbid Rankin Score: No significant disability Modified Rankin: Slight disability     Balance Overall balance assessment: Independent Sitting-balance support: Feet supported Sitting balance-Leahy Scale: Normal     Standing balance support: No upper extremity supported;During functional activity Standing balance-Leahy Scale: Good Standing balance comment: limp with R leg weakness.  No LOB noted.                             Pertinent Vitals/Pain Pain Assessment: No/denies pain Faces Pain Scale: Hurts even more Pain Location: low back, RN aware and gave meds Pain Descriptors / Indicators: Aching;Tender Pain Intervention(s): Monitored during session    Home Living Family/patient expects to be discharged to:: Private residence Living Arrangements: Parent Available Help at Discharge: Family;Available 24 hours/day Type of Home: House Home Access: Level entry     Home Layout: One level Home Equipment: None      Prior Function Level of Independence: Independent         Comments: He lives in [place], [place].  Above information is his mother's home.     Hand Dominance   Dominant Hand: Right    Extremity/Trunk Assessment   Upper Extremity Assessment Upper Extremity Assessment: Overall WFL for tasks assessed RUE Deficits / Details: 4/5 gossly for MMY RUE Sensation: WNL RUE Coordination: WNL    Lower Extremity Assessment Lower Extremity Assessment: RLE deficits/detail RLE Coordination: decreased gross motor    Cervical / Trunk  Assessment Cervical / Trunk Assessment: Normal  Communication   Communication: No difficulties  Cognition Arousal/Alertness: Awake/alert Behavior During Therapy: WFL for tasks assessed/performed Overall Cognitive Status: No family/caregiver present to determine baseline cognitive functioning Area of Impairment: Safety/judgement;Awareness                 Orientation  Level: Disoriented to;Situation     Following Commands: Follows multi-step commands with increased time Safety/Judgement: Decreased awareness of safety     General Comments: Patient reports feeking "fuzzy headed", but better than the previous day.      General Comments      Exercises     Assessment/Plan    PT Assessment Patient needs continued PT services  PT Problem List Decreased strength;Decreased mobility;Decreased activity tolerance;Decreased balance;Decreased safety awareness;Decreased coordination       PT Treatment Interventions Gait training;Therapeutic exercise;Functional mobility training;Therapeutic activities;Patient/family education;Stair training    PT Goals (Current goals can be found in the Care Plan section)  Acute Rehab PT Goals Patient Stated Goal: get something to eat PT Goal Formulation: With patient Time For Goal Achievement: 06/08/21 Potential to Achieve Goals: Good    Frequency Min 2X/week   Barriers to discharge        Co-evaluation               AM-PAC PT "6 Clicks" Mobility  Outcome Measure Help needed turning from your back to your side while in a flat bed without using bedrails?: None Help needed moving from lying on your back to sitting on the side of a flat bed without using bedrails?: None Help needed moving to and from a bed to a chair (including a wheelchair)?: None Help needed standing up from a chair using your arms (e.g., wheelchair or bedside chair)?: None Help needed to walk in hospital room?: None Help needed climbing 3-5 steps with a railing? : A Little 6 Click Score: 23    End of Session   Activity Tolerance: Patient limited by fatigue Patient left: in bed;with call bell/phone within reach Nurse Communication: Mobility status PT Visit Diagnosis: Other abnormalities of gait and mobility (R26.89);Muscle weakness (generalized) (M62.81);Difficulty in walking, not elsewhere classified (R26.2)    Time: 7092-9574 PT  Time Calculation (min) (ACUTE ONLY): 8 min   Charges:   PT Evaluation $PT Eval Low Complexity: 1 Low          Maurissa Ambrose, PT, GCS 06/01/21,1:22 PM
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
## 2021-06-01 NOTE — Anesthesia Preprocedure Evaluation (Signed)
Anesthesia Evaluation  Patient identified by MRN, date of birth, ID band Patient awake    Reviewed: Allergy & Precautions, NPO status , Patient's Chart, lab work & pertinent test results  Airway Mallampati: I  TM Distance: >3 FB Neck ROM: Full    Dental no notable dental hx.    Pulmonary asthma , Current Smoker,    Pulmonary exam normal breath sounds clear to auscultation       Cardiovascular negative cardio ROS Normal cardiovascular exam Rhythm:Regular Rate:Normal     Neuro/Psych PSYCHIATRIC DISORDERS CVA    GI/Hepatic negative GI ROS, (+) Hepatitis -  Endo/Other  negative endocrine ROS  Renal/GU ARFRenal disease  negative genitourinary   Musculoskeletal  (+) Arthritis ,   Abdominal Normal abdominal exam  (+)   Peds negative pediatric ROS (+)  Hematology negative hematology ROS (+)   Anesthesia Other Findings   Reproductive/Obstetrics negative OB ROS                             Anesthesia Physical Anesthesia Plan  ASA: 2  Anesthesia Plan: General   Post-op Pain Management:    Induction: Intravenous  PONV Risk Score and Plan: 1 and Propofol infusion, TIVA and Treatment may vary due to age or medical condition  Airway Management Planned: Natural Airway  Additional Equipment: None  Intra-op Plan:   Post-operative Plan:   Informed Consent: I have reviewed the patients History and Physical, chart, labs and discussed the procedure including the risks, benefits and alternatives for the proposed anesthesia with the patient or authorized representative who has indicated his/her understanding and acceptance.     Dental advisory given  Plan Discussed with: CRNA and Anesthesiologist  Anesthesia Plan Comments:         Anesthesia Quick Evaluation

## 2021-06-01 NOTE — Evaluation (Signed)
Occupational Therapy Evaluation Patient Details Name: Anthony Ward MRN: [id number] DOB: 11/18/1985 Today's Date: 06/01/2021    History of Present Illness 35 year old male with history of drug abuse and HTN who was brought here for the evaluation of confusion. MRI confirmed left sided hemorrhage .MRA showed Left MCA occlusion.   Clinical Impression   Patient admitted for the above diagnosis.  PTA he lives in [place], [place] with his girlfriend.  Patient had residual R leg weakness, but is otherwise independent with all mobility and self care.  He is currently not working.  Currently he is needing supervision for in room mobility/toileting, and self care from a sit/stand level.  Barriers are listed below.  OT to follow in the acute setting to maximize his functional status.  He is hoping to return to his mother's home when cleared medically.  VSS on room air.      Follow Up Recommendations  Outpatient OT;Other (comment) (for cognition, unless he continues to clear.)    Equipment Recommendations  None recommended by OT    Recommendations for Other Services       Precautions / Restrictions Precautions Precautions: Fall Restrictions Weight Bearing Restrictions: No      Mobility Bed Mobility Overal bed mobility: Independent               Patient Response: Cooperative  Transfers Overall transfer level: Needs assistance   Transfers: Sit to/from Stand;Stand Pivot Transfers Sit to Stand: Supervision Stand pivot transfers: Supervision       General transfer comment: R sided weakness.  He states that he had R leg weakness prior, but that it is worse than PLOF.    Balance Overall balance assessment: Needs assistance Sitting-balance support: Feet supported Sitting balance-Leahy Scale: Good     Standing balance support: No upper extremity supported Standing balance-Leahy Scale: Fair Standing balance comment: limp with R leg weakness.  No LOB noted.                            ADL either performed or assessed with clinical judgement   ADL Overall ADL's : Needs assistance/impaired Eating/Feeding: NPO   Grooming: Wash/dry hands;Wash/dry face;Supervision/safety;Standing           Upper Body Dressing : Set up;Sitting   Lower Body Dressing: Supervision/safety;Sit to/from stand   Toilet Transfer: Supervision/safety;RW           Functional mobility during ADLs: Supervision/safety General ADL Comments: R leg weakness and decreased coordination noted - complicated by low back discomfort.     Vision Patient Visual Report: No change from baseline       Perception     Praxis      Pertinent Vitals/Pain Pain Assessment: Faces Faces Pain Scale: Hurts even more Pain Location: low back, RN aware and gave meds Pain Descriptors / Indicators: Aching;Tender Pain Intervention(s): Monitored during session     Hand Dominance Right   Extremity/Trunk Assessment Upper Extremity Assessment Upper Extremity Assessment: RUE deficits/detail RUE Deficits / Details: 4/5 gossly for MMY RUE Sensation: WNL RUE Coordination: WNL   Lower Extremity Assessment Lower Extremity Assessment: Defer to PT evaluation   Cervical / Trunk Assessment Cervical / Trunk Assessment: Normal   Communication Communication Communication: No difficulties   Cognition Arousal/Alertness: Awake/alert Behavior During Therapy: WFL for tasks assessed/performed Overall Cognitive Status: Impaired/Different from baseline Area of Impairment: Orientation;Awareness;Following commands;Safety/judgement                 Orientation Level:  Time;Disoriented to;Situation     Following Commands: Follows multi-step commands with increased time Safety/Judgement: Decreased awareness of safety     General Comments: Patient reports feeking "fuzzy headed", but better than the previous day.   General Comments                  Home Living Family/patient expects to be  discharged to:: Private residence Living Arrangements: Parent Available Help at Discharge: Family;Available 24 hours/day Type of Home: House Home Access: Level entry     Home Layout: One level     Bathroom Shower/Tub: Tub/shower unit;Walk-in shower   Bathroom Toilet: Standard Bathroom Accessibility: Yes How Accessible: Accessible via walker Home Equipment: None          Prior Functioning/Environment Level of Independence: Independent        Comments: He lives in [place], [place].  Above information is his mother's home.        OT Problem List: Decreased strength;Impaired balance (sitting and/or standing);Decreased cognition      OT Treatment/Interventions: Self-care/ADL training;Therapeutic exercise;Balance training;Therapeutic activities    OT Goals(Current goals can be found in the care plan section) Acute Rehab OT Goals Patient Stated Goal: Return home OT Goal Formulation: With patient Time For Goal Achievement: 06/15/21 Potential to Achieve Goals: Good ADL Goals Pt Will Perform Grooming: Independently;sitting;standing Pt Will Perform Lower Body Bathing: Independently;sit to/from stand Pt Will Perform Lower Body Dressing: Independently;sit to/from stand Pt Will Transfer to Toilet: Independently;ambulating;regular height toilet  OT Frequency: Min 2X/week   Barriers to D/C:    none noted       Co-evaluation              AM-PAC OT "6 Clicks" Daily Activity     Outcome Measure Help from another person eating meals?: None Help from another person taking care of personal grooming?: None Help from another person toileting, which includes using toliet, bedpan, or urinal?: A Little Help from another person bathing (including washing, rinsing, drying)?: A Little Help from another person to put on and taking off regular upper body clothing?: A Little Help from another person to put on and taking off regular lower body clothing?: A Little 6 Click Score: 20   End  of Session Equipment Utilized During Treatment: Gait belt Nurse Communication: Mobility status  Activity Tolerance: Patient tolerated treatment well Patient left: in bed;with call bell/phone within reach;with family/visitor present  OT Visit Diagnosis: Unsteadiness on feet (R26.81);Muscle weakness (generalized) (M62.81);Pain Pain - Right/Left:  (low back)                Time: 1020-1036 OT Time Calculation (min): 16 min Charges:  OT General Charges $OT Visit: 1 Visit OT Evaluation $OT Eval Moderate Complexity: 1 Mod  06/01/2021  Rich, OTR/L  Acute Rehabilitation Services  Office:  [phone]   Suzanna Obey 06/01/2021, 10:50 AM

## 2021-06-01 NOTE — Interval H&P Note (Signed)
History and Physical Interval Note:  06/01/2021 2:16 PM  Anthony Ward  has presented today for surgery, with the diagnosis of BACTEREMIA, IV DRUG USE.  The various methods of treatment have been discussed with the patient and family. After consideration of risks, benefits and other options for treatment, the patient has consented to  Procedure(s): TRANSESOPHAGEAL ECHOCARDIOGRAM (TEE) (N/A) as a surgical intervention.  The patient's history has been reviewed, patient examined, no change in status, stable for surgery.  I have reviewed the patient's chart and labs.  Questions were answered to the patient's satisfaction.     Meriam Sprague

## 2021-06-01 NOTE — Procedures (Signed)
     Transesophageal Echocardiogram Note  Lavern Maslow [id number] 1986/05/16  Procedure: Transesophageal Echocardiogram Indications: Stroke, fever  Procedure Details Consent: Obtained Time Out: Verified patient identification, verified procedure, site/side was marked, verified correct patient position, special equipment/implants available, Radiology Safety Procedures followed,  medications/allergies/relevent history reviewed, required imaging and test results available.  Performed  Medications: Propofol 358mg   Left Ventrical:  LVEF 60-65%  Mitral Valve: Mild thickening with fibrinous material visualized on the posterior mitral valve leaflet which may represent early vegetation formation vs redundant mitral valve  Aortic Valve: Tricuspid, no AI  Tricuspid Valve: Normal structure, trivial TR  Pulmonic Valve: Normal structure, trivial PI  Left Atrium/ Left atrial appendage: No evidence of LAA thrombus  Atrial septum: No PFO by color doppler or agitated saline study both at rest or with valsalva  Aorta: No significant plaque   Complications: No apparent complications Patient did tolerate procedure well.  , MD 06/01/2021, 2:58 PM

## 2021-06-01 NOTE — Plan of Care (Signed)
  Problem: Education: Goal: Knowledge of General Education information will improve Description: Including pain rating scale, medication(s)/side effects and non-pharmacologic comfort measures Outcome: Progressing   Problem: Health Behavior/Discharge Planning: Goal: Ability to manage health-related needs will improve Outcome: Progressing   Problem: Clinical Measurements: Goal: Ability to maintain clinical measurements within normal limits will improve Outcome: Progressing Goal: Will remain free from infection Outcome: Progressing Goal: Diagnostic test results will improve Outcome: Progressing Goal: Respiratory complications will improve Outcome: Progressing Goal: Cardiovascular complication will be avoided Outcome: Progressing   Problem: Nutrition: Goal: Adequate nutrition will be maintained Outcome: Progressing   Problem: Coping: Goal: Level of anxiety will decrease Outcome: Progressing   Problem: Skin Integrity: Goal: Risk for impaired skin integrity will decrease Outcome: Progressing   Problem: Education: Goal: Knowledge of disease or condition will improve Outcome: Progressing Goal: Knowledge of secondary prevention will improve Outcome: Progressing Goal: Knowledge of patient specific risk factors addressed and post discharge goals established will improve Outcome: Progressing Goal: Individualized Educational Video(s) Outcome: Progressing   Problem: Nutrition: Goal: Risk of aspiration will decrease Outcome: Progressing   Problem: Health Behavior/Discharge Planning: Goal: Ability to manage health-related needs will improve Outcome: Progressing   Problem: Nutrition: Goal: Risk of aspiration will decrease Outcome: Progressing   Problem: Intracerebral Hemorrhage Tissue Perfusion: Goal: Complications of Intracerebral Hemorrhage will be minimized Outcome: Progressing

## 2021-06-01 NOTE — Progress Notes (Signed)
SLP Cancellation Note  Patient Details Name: Anthony Ward MRN: [id number] DOB: [birth year]   Cancelled treatment:       Reason Eval/Treat Not Completed: Other (comment) (Pt NPO for procedure); passed Yale swallow screen   Tressie Stalker, M.S., CCC-SLP 06/01/2021, 11:36 AM
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
## 2021-06-01 NOTE — Anesthesia Procedure Notes (Signed)
Procedure Name: MAC Date/Time: 06/01/2021 2:29 PM Performed by: Reece Agar, CRNA Pre-anesthesia Checklist: Patient identified, Emergency Drugs available, Suction available, Patient being monitored and Timeout performed Patient Re-evaluated:Patient Re-evaluated prior to induction Oxygen Delivery Method: Nasal cannula

## 2021-06-01 NOTE — Progress Notes (Signed)
Pharmacy Antibiotic Note  Anthony Ward is a 35 y.o. male admitted on 05/29/2021 with  suspected endocarditis .  Pharmacy has been consulted for vancomycin dosing.  Upon admission, patient presented with AMS and was treated empirically for suspected meningitis with CTX/Vanc/Amp/Acyclovir. Meningitis ruled out and antimicrobials Dcd 7/26.   7/27 TEE showed concerns for endocarditis. Patient now being followed by ID and empirically treated for IE. Ceftriaxone and vancomycin are being resumed. Patient previously loaded with vancomycin 7/24 and received vancomycin/ceftriaxone through 7/26. No loading dose given, resuming vancomycin at higher dosing based on AUC calculator recommendation and improved renal function.    Calculated AUC: 506.2 (CrCl ~95 mL/min (IBW))   Plan: Vancomycin 1250 IV every 12 hours. Goal AUC 400-600.  Height: 5\' 9"  (175.3 cm) Weight: 82 kg (180 lb 12.4 oz) IBW/kg (Calculated) : 70.7  Temp (24hrs), Avg:98 F (36.7 C), Min:97.6 F (36.4 C), Max:98.4 F (36.9 C)  Recent Labs  Lab 05/29/21 1835 05/29/21 1850 05/29/21 1901 05/29/21 2035 05/30/21 0306 05/30/21 0339 05/31/21 0105 06/01/21 0259  WBC 9.4  --   --   --  8.7  --  10.0  --   CREATININE 2.10*  --  2.20*  --  1.43*  --  1.00 1.09  LATICACIDVEN  --  2.9*  --  2.0*  --  1.1  --   --     Estimated Creatinine Clearance: 95.5 mL/min (by C-G formula based on SCr of 1.09 mg/dL).    Allergies  Allergen Reactions   Other Shortness Of Breath    Alcohol and beer.  Alcohol and beer.      Antimicrobials this admission: Ampicillin 7/24 >> 7/26 Ceftriaxone 7/24 >> 7/26 ; 7/27 >> P Vancomycin 7/24 >> 7/26 ; 7/27 >> P  Dose adjustments this admission: N/A  Microbiology results: 7/27 TEE: suspected early vegetation formation  7/25 CSF: unremarkable 7/24 Bcx: NGTD   Thank you for allowing pharmacy to be a part of this patient's care.  8/24, PharmD Clinical Pharmacist 06/01/2021 4:43  PM

## 2021-06-02 ENCOUNTER — Encounter (HOSPITAL_COMMUNITY): Payer: Self-pay | Admitting: Cardiology

## 2021-06-02 DIAGNOSIS — R7989 Other specified abnormal findings of blood chemistry: Secondary | ICD-10-CM

## 2021-06-02 DIAGNOSIS — I059 Rheumatic mitral valve disease, unspecified: Secondary | ICD-10-CM

## 2021-06-02 DIAGNOSIS — R4182 Altered mental status, unspecified: Secondary | ICD-10-CM

## 2021-06-02 DIAGNOSIS — K700 Alcoholic fatty liver: Secondary | ICD-10-CM

## 2021-06-02 DIAGNOSIS — G9340 Encephalopathy, unspecified: Secondary | ICD-10-CM

## 2021-06-02 DIAGNOSIS — F1910 Other psychoactive substance abuse, uncomplicated: Secondary | ICD-10-CM

## 2021-06-02 DIAGNOSIS — I619 Nontraumatic intracerebral hemorrhage, unspecified: Secondary | ICD-10-CM

## 2021-06-02 DIAGNOSIS — R7401 Elevation of levels of liver transaminase levels: Secondary | ICD-10-CM

## 2021-06-02 DIAGNOSIS — I6330 Cerebral infarction due to thrombosis of unspecified cerebral artery: Secondary | ICD-10-CM

## 2021-06-02 DIAGNOSIS — N179 Acute kidney failure, unspecified: Secondary | ICD-10-CM

## 2021-06-02 LAB — COMPREHENSIVE METABOLIC PANEL
ALT: 678 U/L — ABNORMAL HIGH (ref 0–44)
AST: 862 U/L — ABNORMAL HIGH (ref 15–41)
Albumin: 2.9 g/dL — ABNORMAL LOW (ref 3.5–5.0)
Alkaline Phosphatase: 40 U/L (ref 38–126)
Anion gap: 9 (ref 5–15)
BUN: 14 mg/dL (ref 6–20)
CO2: 23 mmol/L (ref 22–32)
Calcium: 8.6 mg/dL — ABNORMAL LOW (ref 8.9–10.3)
Chloride: 102 mmol/L (ref 98–111)
Creatinine, Ser: 1.03 mg/dL (ref 0.61–1.24)
GFR, Estimated: >60 mL/min (ref >60)
Glucose, Bld: 122 mg/dL — ABNORMAL HIGH (ref 70–99)
Potassium: 3.6 mmol/L (ref 3.5–5.1)
Sodium: 134 mmol/L — ABNORMAL LOW (ref 135–145)
Total Bilirubin: 0.4 mg/dL (ref 0.3–1.2)
Total Protein: 5.5 g/dL — ABNORMAL LOW (ref 6.5–8.1)

## 2021-06-02 LAB — ALPHA-1-ANTITRYPSIN: A-1 Antitrypsin, Ser: 164 mg/dL (ref 95–164)

## 2021-06-02 LAB — GLUCOSE, CAPILLARY
Glucose-Capillary: 104 mg/dL — ABNORMAL HIGH (ref 70–99)
Glucose-Capillary: 135 mg/dL — ABNORMAL HIGH (ref 70–99)
Glucose-Capillary: 136 mg/dL — ABNORMAL HIGH (ref 70–99)

## 2021-06-02 LAB — MITOCHONDRIAL ANTIBODIES: Mitochondrial M2 Ab, IgG: <20 Units (ref 0.0–20.0)

## 2021-06-02 LAB — CSF CULTURE W GRAM STAIN: Culture: NO GROWTH

## 2021-06-02 LAB — ANTI-SMOOTH MUSCLE ANTIBODY, IGG: F-Actin IgG: 5 Units (ref 0–19)

## 2021-06-02 LAB — CERULOPLASMIN: Ceruloplasmin: 24.2 mg/dL (ref 16.0–31.0)

## 2021-06-02 MED ORDER — SODIUM CHLORIDE 0.9 % IV SOLN: INTRAVENOUS | Status: DC
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
## 2021-06-02 NOTE — Progress Notes (Signed)
PROGRESS NOTE    Anthony Ward  JGG:[id number] DOB: 1985/12/07 DOA: 05/29/2021 PCP: Pcp, No   Chief Complain:Confusion  Brief Narrative: Patient is a 35 year old male with history of drug abuse who was brought here for the evaluation of confusion.  On presentation he was mildly febrile.  Lab work showed elevated liver enzymes, elevated troponin.  CT head showed left-sided focal hemorrhages.    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 There is also concern for possible meningitis/encephalitis for which lumbar puncture has been done and is nonreassuring.  Underwent TEE with finding of possible vegetation in the mitral valve.  Started on vancomycin, ceftriaxone.  ID, GI following  Assessment & Plan:   Principal Problem:   Acute encephalopathy Active Problems:   Elevated LFTs   ARF (acute renal failure) (HCC)   Cerebral thrombosis with cerebral infarction   Elevated liver enzymes   Alcoholic fatty liver  Left MCA stroke: Suspicion for left sided embolic stroke secondary to large vessel disease.  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 Neurology following.  Echo showed EF of 55 to 60%, no intracardiac source of emboli.  Plan for TEE. Carotid Doppler did not show any significant findings. LDL of 123.HbA1c of 5.6.  Statins not started due to elevated liver enzymes. PT/OT/speech evaluation pending Has minimal focal neurodeficits, he had slight weakness on the right lower extremity.  He is completely alert and oriented today.  Mitral valve vegetation: TEE showed mild thickening with fibrinous material visualized on the posterior mitral valve representing early vegetation versus redundant mitral valve.  Started on vancomycin, ceftriaxone.  Blood cultures have  been negative.  Repeat blood culture sent on 7/27.  ID has been following.  Elevated liver enzymes/CK: Hepatitis panel negative. Has history of alcohol abuse.  Liver enzymes did not improve that much continued up today.  US Doppler liver did not show significant abnormality except for steatosis. GI consulted, autoimmune work-up being sent/pending.  Iron panel normal.  CK severely elevated.,likely contributing to elevated liver enzymes.  Continue IV fluid   Suspicion for meningitis/encephalitis: Suspected on admission.  Underwent lumbar puncture.  White cell count of just 1.  We  discontinued  antibiotics.  CSF gram stain did not show any organism.  Total protein is within normal limit.  Aspiration pneumonia: CT chest on presentation showed patchy areas of consolidation, likely from aspiration secondary to altered mental status.  Currently his lungs are clear and he does not have any signs of pneumonia.  Antibiotics discontinued.  Elevated troponin: Denies any chest pain.  Cardiology following.  Plan for TEE.  Troponins trended down  Hypertension: Not on home medications.  Continue to monitor blood pressure.  Continue as needed meds  for severe hypertension.  Drug abuse: History of drug abuse, recently relapsed.  UDS positive for cocaine, THC.           DVT prophylaxis:SCD Ward Status: Full Family Communication: Discussed with sister on phone on 7/27 Status is: Inpatient  Remains inpatient appropriate because: Ongoing work-up  Dispo: The patient is from: Home              Anticipated d/c is to: Home              Patient currently is not medically stable to d/c.   Difficult to place patient No  Consultants: Neurology, cardiology  Procedures: LP  Antimicrobials:  Anti-infectives (From admission, onward)    Start     Dose/Rate Route Frequency Ordered Stop   06/01/21 1800  cefTRIAXone (ROCEPHIN) 2 g in sodium chloride 0.9 % 100 mL IVPB        2 g 200 mL/hr over 30 Minutes  Intravenous Every 24 hours 06/01/21 1603     06/01/21 1800  vancomycin (VANCOREADY) IVPB 1250 mg/250 mL        1,250 mg 166.7 mL/hr over 90 Minutes Intravenous Every 12 hours 06/01/21 1704     05/30/21 2130  acyclovir (ZOVIRAX) 815 mg in dextrose 5 % 150 mL IVPB  Status:  Discontinued        10 mg/kg  81.6 kg 166.3 mL/hr over 60 Minutes Intravenous Every 8 hours 05/30/21 1329 05/31/21 0803   05/30/21 1600  acyclovir (ZOVIRAX) 750 mg in dextrose 5 % 150 mL IVPB  Status:  Discontinued        750 mg 165 mL/hr over 60 Minutes Intravenous Every 12 hours 05/30/21 0158 05/30/21 1043   05/30/21 1300  acyclovir (ZOVIRAX) 815 mg in dextrose 5 % 150 mL IVPB  Status:  Discontinued        10 mg/kg  81.6 kg 166.3 mL/hr over 60 Minutes Intravenous Every 8 hours 05/30/21 1043 05/30/21 1329   05/30/21 1100  vancomycin (VANCOREADY) IVPB 750 mg/150 mL  Status:  Discontinued        750 mg 150 mL/hr over 60 Minutes Intravenous Every 12 hours 05/29/21 2114 05/31/21 0803   05/30/21 0800  cefTRIAXone (ROCEPHIN) 2 g in sodium chloride 0.9 % 100 mL IVPB  Status:  Discontinued        2 g 200 mL/hr over 30 Minutes Intravenous Every 12 hours 05/30/21 0147 05/31/21 0803   05/30/21 0200  ampicillin (OMNIPEN) 2 g in sodium chloride 0.9 % 100 mL IVPB  Status:  Discontinued        2 g 300 mL/hr over 20 Minutes Intravenous Every 6 hours 05/30/21 0147 05/31/21 0803   05/30/21 0200  acyclovir (ZOVIRAX) 750 mg in dextrose 5 % 150 mL IVPB        750 mg 165 mL/hr over 60 Minutes Intravenous Once 05/30/21 0158 05/30/21 0606   05/29/21 2130  vancomycin (VANCOREADY) IVPB 500 mg/100 mL       See Hyperspace for full Linked Orders Report.   500 mg 100 mL/hr over 60 Minutes Intravenous  Once 05/29/21 2018 05/30/21 0423   05/29/21 2030  vancomycin (VANCOCIN) IVPB 1000 mg/200 mL premix       See Hyperspace for full Linked Orders Report.   1,000 mg 200 mL/hr over 60 Minutes Intravenous  Once 05/29/21 2018 05/29/21 2224   05/29/21  2015  cefTRIAXone (ROCEPHIN) 2 g in sodium chloride 0.9 % 100 mL IVPB        2 g 200 mL/hr over 30 Minutes Intravenous  Once 05/29/21 2006 05/29/21 2038       Subjective: Patient seen and examined at the bedside this morning.  Hemodynamically stable.  Denies any complaints  Objective: Vitals:   06/01/21 1520 06/01/21 1635 06/02/21 0015 06/02/21 0300  BP: (!) 149/90 115/83 (!) 131/96 140/90  Pulse: 74 75 (!) 117 85  Resp: 18 18 18 18   Temp:  97.7 F (36.5 C) 97.8 F (36.6 C) 97.8 F (36.6 C)  TempSrc:  Oral Oral Oral  SpO2: 98% 99% 97% 99%  Weight:  Height:        Intake/Output Summary (Last 24 hours) at 06/02/2021 0742 Last data filed at 06/01/2021 2000 Gross per 24 hour  Intake 600 ml  Output 0 ml  Net 600 ml   Filed Weights   05/29/21 2044 05/31/21 0500  Weight: 81.6 kg 82 kg    Examination:  General exam: Overall comfortable, not in distress HEENT: PERRL Respiratory system:  no wheezes or crackles  Cardiovascular system: S1 & S2 heard, RRR.  Gastrointestinal system: Abdomen is nondistended, soft and nontender. Central nervous system: Alert and oriented Extremities: No edema, no clubbing ,no cyanosis Skin: No rashes, no ulcers,no icterus  ,tattos    Data Reviewed: I have personally reviewed following labs and imaging studies  CBC: Recent Labs  Lab 05/29/21 1835 05/29/21 1901 05/29/21 1902 05/30/21 0306 05/31/21 0105  WBC 9.4  --   --  8.7 10.0  NEUTROABS 7.9*  --   --  7.7 7.9*  HGB 15.4 18.4* 18.7* 15.2 14.1  HCT 43.9 54.0* 55.0* 43.8 39.8  MCV 85.4  --   --  87.4 85.4  PLT 225  --   --  187 209   Basic Metabolic Panel: Recent Labs  Lab 05/29/21 1835 05/29/21 1901 05/29/21 1902 05/30/21 0306 05/31/21 0105 06/01/21 0259 06/02/21 0307  NA 131* 132* 132* 133* 136 137 134*  K 4.9 5.2* 5.3* 4.6 4.1 4.2 3.6  CL 91* 94*  --  99 102 103 102  CO2 28  --   --  GLUCOSE 172* 164*  --  128* 141* 113* 122*  BUN 28* 28*  --  CREATININE 2.10* 2.20*  --  1.43* 1.00 1.09 1.03  CALCIUM 7.8*  --   --  7.9* 7.8* 8.6* 8.6*   GFR: Estimated Creatinine Clearance: 101.1 mL/min (by C-G formula based on SCr of 1.03 mg/dL). Liver Function Tests: Recent Labs  Lab 05/29/21 1835 05/30/21 0306 05/31/21 0105 06/01/21 0259 06/02/21 0307  AST 1,462*  1,503* 1,564* 986* 1,120* 862*  ALT 731*  728* 730* 600* 706* 678*  ALKPHOS 63  62 49 42 44 40  BILITOT 1.6*  1.4* 1.1 0.9 0.9 0.4  PROT 7.7  7.6 6.3* 5.1* 5.6* 5.5*  ALBUMIN 4.4  4.3 3.4* 2.7* 3.0* 2.9*   Recent Labs  Lab 05/29/21 1835  LIPASE 27   Recent Labs  Lab 05/29/21 1849  AMMONIA 30   Coagulation Profile: Recent Labs  Lab 05/29/21 1835 05/30/21 0306  INR 1.0  1.0 1.1   Cardiac Enzymes: Recent Labs  Lab 06/01/21 1141  CKTOTAL 32,193*   BNP (last 3 results) No results for input(s): PROBNP in the last 8760 hours. HbA1C: No results for input(s): HGBA1C in the last 72 hours.  CBG: Recent Labs  Lab 05/31/21 2353 06/01/21 0619 06/01/21 1252 06/02/21 0053 06/02/21 0617  GLUCAP 147* 121* 106* 136* 104*   Lipid Profile: Recent Labs    05/30/21 1420  CHOL 182  HDL 51  LDLCALC 123*  TRIG 42  CHOLHDL 3.6   Thyroid Function Tests: No results for input(s): TSH, T4TOTAL, FREET4, T3FREE, THYROIDAB in the last 72 hours. Anemia Panel: Recent Labs    06/01/21 1141  FERRITIN 261  TIBC 210*  IRON 53   Sepsis Labs: Recent Labs  Lab 05/29/21 1850 05/29/21 2035 05/30/21 0339  LATICACIDVEN 2.9* 2.0* 1.1    Recent Results (from the past 240 hour(s))  Culture, blood (routine  x 2)     Status: None (Preliminary result)   Collection Time: 05/29/21  6:35 PM   Specimen: Left Antecubital; Blood  Result Value Ref Range Status   Specimen Description   Final    LEFT ANTECUBITAL BLOOD Performed at [facility], [address] [place], [place] [address]    Special Requests   Final    BOTTLES DRAWN AEROBIC AND  ANAEROBIC Blood Culture adequate volume Performed at [facility], [address] [place], [place] [address]    Culture   Final    NO GROWTH 2 DAYS Performed at [facility], [address], [place], [place] [address]    Report Status PENDING  Incomplete  Culture, blood (routine x 2)     Status: None (Preliminary result)   Collection Time: 05/29/21  6:35 PM   Specimen: BLOOD RIGHT HAND  Result Value Ref Range Status   Specimen Description   Final    BLOOD RIGHT HAND Performed at [facility], [address] [place], [place] [address]    Special Requests   Final    BOTTLES DRAWN AEROBIC AND ANAEROBIC Blood Culture results may not be optimal due to an excessive volume of blood received in culture bottles Performed at [facility], [address] [place], [place] [address]    Culture   Final    NO GROWTH 2 DAYS Performed at [facility], [address] [place], [place] [address]    Report Status PENDING  Incomplete  Resp Panel by RT-PCR (Flu A&B, Covid) Nasopharyngeal Swab     Status: None   Collection Time: 05/29/21  8:32 PM   Specimen: Nasopharyngeal Swab; Nasopharyngeal(NP) swabs in vial transport medium  Result Value Ref Range Status   SARS Coronavirus 2 by RT PCR NEGATIVE NEGATIVE Final    Comment: (NOTE) SARS-CoV-2 target nucleic acids are NOT DETECTED.  The SARS-CoV-2 RNA is generally detectable in upper respiratory specimens during the acute phase of infection. The lowest concentration of SARS-CoV-2 viral copies this assay can detect is 138 copies/mL. A negative result does not preclude SARS-Cov-2 infection and should not be used as the sole basis for treatment or other patient management decisions. A negative result may occur with  improper specimen collection/handling, submission of specimen other than nasopharyngeal swab, presence of viral mutation(s) within the areas targeted by this assay, and inadequate number of  viral copies(<138 copies/mL). A negative result must be combined with clinical observations, patient history, and epidemiological information. The expected result is Negative.  Fact Sheet for Patients:  BloggerCourse.com  Fact Sheet for Healthcare Providers:  SeriousBroker.it  This test is no t yet approved or cleared by the [place] FDA and  has been authorized for detection and/or diagnosis of SARS-CoV-2 by FDA under an Emergency Use Authorization (EUA). This EUA will remain  in effect (meaning this test can be used) for the duration of the COVID-19 declaration under Section 564(b)(1) of the Act, 21 U.S.C.section 360bbb-3(b)(1), unless the authorization is terminated  or revoked sooner.       Influenza A by PCR NEGATIVE NEGATIVE Final   Influenza B by PCR NEGATIVE NEGATIVE Final    Comment: (NOTE) The Xpert Xpress SARS-CoV-2/FLU/RSV plus assay is intended as an aid in the diagnosis of influenza from Nasopharyngeal swab specimens and should not be used as a sole basis for treatment. Nasal washings and aspirates are unacceptable for Xpert Xpress SARS-CoV-2/FLU/RSV testing.  Fact Sheet for Patients: BloggerCourse.com  Fact Sheet for Healthcare Providers: SeriousBroker.it  This test is not yet approved or cleared by the [place] FDA and has been authorized for detection and/or diagnosis of SARS-CoV-2 by FDA under an Emergency Use Authorization (EUA). This EUA will remain in effect (meaning this test can be used) for the duration of the COVID-19 declaration under Section 564(b)(1) of the Act, 21 U.S.C. section 360bbb-3(b)(1), unless the authorization is terminated or revoked.  Performed at [facility], [address] [place], [place] [address]   CSF culture w Stat Gram Stain     Status: None (Preliminary result)   Collection Time: 05/30/21  7:41 AM    Specimen: CSF; Cerebrospinal Fluid  Result Value Ref Range Status   Specimen Description CSF  Final   Special Requests NONE  Final   Gram Stain   Final    WBC PRESENT, PREDOMINANTLY MONONUCLEAR NO ORGANISMS SEEN CYTOSPIN SMEAR    Culture   Final    NO GROWTH 2 DAYS Performed at [facility], [address] [place], [place] [address]    Report Status PENDING  Incomplete         Radiology Studies: ECHO TEE  Result Date: 06/01/2021    TRANSESOPHOGEAL ECHO REPORT   Patient Name:   Anthony Ward Date of Exam: 06/01/2021 Medical Rec #:  [id number]      Height:       69.0 in Accession #:    [id number]     Weight:       180.8 lb Date of Birth:  12/15/1985     BSA:          1.979 m Patient Age:    34 years       BP:           117/78 mmHg Patient Gender: M              HR:           77 bpm. Exam Location:  Inpatient Procedure: Transesophageal Echo, 3D Echo, Cardiac Doppler and Color Doppler Indications:    Stroke  History:        Patient has prior history of Echocardiogram examinations.  Sonographer:    Leta Jungling RDCS Referring Phys: [id number] ANGELA NICOLE DUKE PROCEDURE: After discussion of the risks and benefits of a TEE, an informed consent was obtained from the patient. The transesophogeal probe was passed without difficulty through the esophogus of the patient. Imaged were obtained with the patient in a left lateral decubitus position. Sedation performed by different physician. The patient was monitored while under deep sedation. Anesthestetic sedation was provided intravenously by Anesthesiology: 358.  of Propofol. Image quality was good. The patient's vital signs; including heart rate, blood pressure, and oxygen saturation; remained stable throughout the procedure. The patient developed no complications during the procedure. IMPRESSIONS  1. There is mild thickening and fibrinous stranding noted on the atrial side of the posterior mitral valve leaflet. The fibrinous stranding appears  to have independent motion from the leaflet itself. Given clinical picture, findings concerning for early  vegetation formation/infective endocarditis. Less likely degenerative changes of the mitral vale. There is mild mitral valve regurgitation.  2. Left ventricular ejection fraction, by estimation, is 60 to 65%. The left ventricle has normal function.  3. Right ventricular systolic function is normal. The right ventricular size is normal.  4. No left atrial/left atrial appendage thrombus was detected.  5. The aortic valve is  tricuspid. Aortic valve regurgitation is trivial. No vegetation visualized.  6. Agitated saline contrast was given intravenously to evaluate for intracardiac shunting. A single bubble is visualized after 4 cardiac cycles on both rest and with valsalva. The bubbles appear to be coming from the pulmonary veins suggestive of an intrapulmonary shunt over PFO. If high clinical suspicion, could consider transcranial doppler for further work-up. FINDINGS  Left Ventricle: Left ventricular ejection fraction, by estimation, is 60 to 65%. The left ventricle has normal function. The left ventricular internal cavity size was normal in size. Right Ventricle: The right ventricular size is normal. No increase in right ventricular wall thickness. Right ventricular systolic function is normal. Left Atrium: Left atrial size was normal in size. No left atrial/left atrial appendage thrombus was detected. Right Atrium: Right atrial size was normal in size. Pericardium: There is no evidence of pericardial effusion. Mitral Valve: There is mild thickening and fibrinous stranding noted on the atrial side of the posterior mitral valve leaflet. The fibrinous stranding appears to have independent motion from the leaflet itself. 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 Less likely degenerative changes of the mitral vale. The mitral valve is abnormal. Mild mitral  valve regurgitation. Tricuspid Valve: The tricuspid valve is normal in structure. Tricuspid valve regurgitation is trivial. There is no evidence of tricuspid valve vegetation. Aortic Valve: The aortic valve is tricuspid. Aortic valve regurgitation is trivial. There is no evidence of aortic valve vegetation. Pulmonic Valve: The pulmonic valve was normal in structure. Pulmonic valve regurgitation is trivial. There is no evidence of pulmonic valve vegetation. Aorta: The aortic root and ascending aorta are structurally normal, with no evidence of dilitation. IAS/Shunts: No atrial level shunt detected by color flow Doppler. Agitated saline contrast was given intravenously to evaluate for intracardiac shunting. A single bubble is visualized after 4 cardiac cycles on both rest and valsalva studies. The bubbles appear to be coming from the pulmonary veins suggestive of an intrapulmonary shunt over PFO. If high clinical suspicion, could consider transcranial doppler for further work-up. Laurance FlattenHeather Pemberton MD Electronically signed by Laurance FlattenHeather Pemberton MD Signature Date/Time: 06/01/2021/4:44:40 PM    Final    US LIVER DOPPLER  Result Date: 05/31/2021 CLINICAL DATA:  Elevated liver enzymes and history of drug and alcohol abuse. EXAM: DUPLEX ULTRASOUND OF LIVER TECHNIQUE: Color and duplex Doppler ultrasound was performed to evaluate the hepatic in-flow and out-flow vessels. COMPARISON:  None. FINDINGS: Portal Vein Velocities Main:  27-31 cm/sec Right:  23 cm/sec Left:  19 cm/sec Normally patent portal vein with no evidence of thrombus. Direction of portal vein flow is towards the liver. Portal waveforms are within normal limits. Hepatic Vein Velocities Right:  22 cm/sec Middle:  25 cm/sec Left:  17 cm/sec Normal patent vein waveforms. No evidence of hepatic veno-occlusive disease. Hepatic Artery Velocity:  32 cm/sec Splenic Vein Velocity:  14 cm/sec Varices: None visualized. Ascites: None visualized. The spleen is normal in  size. The liver parenchyma is heterogeneous with increased parenchymal echogenicity. Findings are consistent with at least steatosis. IMPRESSION: 1. No evidence of portal vein thrombosis or significant portal hypertension by hepatic duplex ultrasound. 2. Echogenic liver parenchyma consistent with at least steatosis. Electronically Signed   By: Irish LackGlenn  Yamagata M.D.   On: 05/31/2021 16:39   VAS US LOWER EXTREMITY VENOUS (DVT)  Result Date: 05/31/2021  Lower Venous DVT Study Patient Name:  Anthony ReapCAMERON Ward  Date of Exam:   05/31/2021 Medical Rec #: [id number]  Accession #:    [id number] Date of Birth: 08/03/1986      Patient Gender: M Patient Age:   56Y Exam Location:  [facility] Procedure:      VAS [place] LOWER EXTREMITY VENOUS (DVT) Referring Phys: [id number] Marvel Plan --------------------------------------------------------------------------------  Indications: Stroke. Other Indications: Embolic stroke, history of current smoker, drug use and                    current alcohol use. Comparison Study: No prior. Performing Technologist: Marilynne Halsted RDMS, RVT  Examination Guidelines: A complete evaluation includes B-mode imaging, spectral Doppler, color Doppler, and power Doppler as needed of all accessible portions of each vessel. Bilateral testing is considered an integral part of a complete examination. Limited examinations for reoccurring indications may be performed as noted. The reflux portion of the exam is performed with the patient in reverse Trendelenburg.  +---------+---------------+---------+-----------+----------+--------------+ RIGHT    CompressibilityPhasicitySpontaneityPropertiesThrombus Aging +---------+---------------+---------+-----------+----------+--------------+ CFV      Full           Yes      Yes                                 +---------+---------------+---------+-----------+----------+--------------+ SFJ      Full                                                         +---------+---------------+---------+-----------+----------+--------------+ FV Prox  Full                                                        +---------+---------------+---------+-----------+----------+--------------+ FV Mid   Full                                                        +---------+---------------+---------+-----------+----------+--------------+ FV DistalFull                                                        +---------+---------------+---------+-----------+----------+--------------+ PFV      Full                                                        +---------+---------------+---------+-----------+----------+--------------+ POP      Full           Yes      Yes                                 +---------+---------------+---------+-----------+----------+--------------+ PTV      Full                                                        +---------+---------------+---------+-----------+----------+--------------+  PERO     Full                                                        +---------+---------------+---------+-----------+----------+--------------+   +---------+---------------+---------+-----------+----------+--------------+ LEFT     CompressibilityPhasicitySpontaneityPropertiesThrombus Aging +---------+---------------+---------+-----------+----------+--------------+ CFV      Full           Yes      Yes                                 +---------+---------------+---------+-----------+----------+--------------+ SFJ      Full                                                        +---------+---------------+---------+-----------+----------+--------------+ FV Prox  Full                                                        +---------+---------------+---------+-----------+----------+--------------+ FV Mid   Full                                                         +---------+---------------+---------+-----------+----------+--------------+ FV DistalFull                                                        +---------+---------------+---------+-----------+----------+--------------+ PFV      Full                                                        +---------+---------------+---------+-----------+----------+--------------+ POP      Full           Yes      Yes                                 +---------+---------------+---------+-----------+----------+--------------+ PTV      Full                                                        +---------+---------------+---------+-----------+----------+--------------+ PERO     Full                                                        +---------+---------------+---------+-----------+----------+--------------+  Summary: BILATERAL: - No evidence of deep vein thrombosis seen in the lower extremities, bilaterally. -No evidence of popliteal cyst, bilaterally.   *See table(s) above for measurements and observations. Electronically signed by Waverly Ferrari MD on 05/31/2021 at 5:56:49 PM.    Final         Scheduled Meds:  Chlorhexidine Gluconate Cloth  6 each Topical Daily   heparin injection (subcutaneous)  5,000 Units Subcutaneous Q8H   lidocaine (PF)  5 mL Infiltration Once   Continuous Infusions:  sodium chloride     cefTRIAXone (ROCEPHIN)  IV 2 g (06/01/21 1740)   vancomycin 1,250 mg (06/02/21 0517)     LOS: 3 days    Time spent: 25 mins.More than 50% of that time was spent in counseling and/or coordination of care.      Burnadette Pop, MD [facility] P7/28/2022, 7:42 AM

## 2021-06-02 NOTE — Progress Notes (Signed)
Physical Therapy Treatment Patient Details Name: Anthony Ward MRN: [id number] DOB: [birth year] Today's Date: 06/02/2021    History of Present Illness 35 year old male with history of drug abuse and HTN who was brought here for the evaluation of confusion. MRI confirmed left sided hemorrhage .MRA showed Left MCA occlusion.    PT Comments    Patient received in bed, reports he is feeling good. Independent with bed mobility and transfers. Once up he reported low back pain on left side. He is able to ambulate without ad and supervision but is limping on right LE due to weakness. Patient will continue to benefit from skilled PT while here to improve functional independence. May benefit from outpatient PT to address back pain and strength once discharged.     Follow Up Recommendations  Outpatient PT     Equipment Recommendations  None recommended by PT    Recommendations for Other Services       Precautions / Restrictions Precautions Precaution Comments: mod fall Restrictions Weight Bearing Restrictions: No    Mobility  Bed Mobility Overal bed mobility: Independent                  Transfers Overall transfer level: Independent   Transfers: Sit to/from Stand Sit to Stand: Independent         General transfer comment: R sided weakness.  He states that he had R leg weakness prior, but that it is worse than PLOF.  Ambulation/Gait Ambulation/Gait assistance: Supervision Gait Distance (Feet): 150 Feet Assistive device: None Gait Pattern/deviations: Step-through pattern;Decreased stance time - right;Decreased weight shift to right;Antalgic Gait velocity: WFL   General Gait Details: patient ambulates with antalgic gait pattern, limping on right LE. No lob no AD needed   Stairs             Wheelchair Mobility    Modified Rankin (Stroke Patients Only) Modified Rankin (Stroke Patients Only) Pre-Morbid Rankin Score: No significant disability Modified  Rankin: No significant disability     Balance Overall balance assessment: Mild deficits observed, not formally tested Sitting-balance support: Feet supported Sitting balance-Leahy Scale: Normal     Standing balance support: No upper extremity supported;During functional activity Standing balance-Leahy Scale: Good Standing balance comment: limp with R leg weakness.  No LOB noted.                            Cognition Arousal/Alertness: Awake/alert Behavior During Therapy: WFL for tasks assessed/performed Overall Cognitive Status: Within Functional Limits for tasks assessed                                        Exercises      General Comments        Pertinent Vitals/Pain Pain Assessment: Faces Faces Pain Scale: Hurts little more Pain Location: low back Pain Descriptors / Indicators: Aching;Tender;Tightness Pain Intervention(s): Monitored during session    Home Living                      Prior Function            PT Goals (current goals can now be found in the care plan section) Acute Rehab PT Goals Patient Stated Goal: to return home PT Goal Formulation: With patient Time For Goal Achievement: 06/08/21 Potential to Achieve Goals: Good Progress towards PT goals: Progressing toward goals  Frequency    Min 2X/week      PT Plan Current plan remains appropriate    Co-evaluation              AM-PAC PT "6 Clicks" Mobility   Outcome Measure  Help needed turning from your back to your side while in a flat bed without using bedrails?: None Help needed moving from lying on your back to sitting on the side of a flat bed without using bedrails?: None Help needed moving to and from a bed to a chair (including a wheelchair)?: None Help needed standing up from a chair using your arms (e.g., wheelchair or bedside chair)?: None Help needed to walk in hospital room?: None Help needed climbing 3-5 steps with a railing? : A  Little 6 Click Score: 23    End of Session   Activity Tolerance: Patient limited by pain Patient left: in bed;with call bell/phone within reach Nurse Communication: Mobility status PT Visit Diagnosis: Other abnormalities of gait and mobility (R26.89);Muscle weakness (generalized) (M62.81);Difficulty in walking, not elsewhere classified (R26.2)     Time: 9509-3267 PT Time Calculation (min) (ACUTE ONLY): 19 min  Charges:  $Gait Training: 8-22 mins                     [employer], PT, GCS 06/02/21,10:11 AM

## 2021-06-02 NOTE — Progress Notes (Addendum)
Daily Rounding Note  06/02/2021, 1:18 PM  LOS: 3 days   SUBJECTIVE:   Chief complaint:  Elevated LFTs.     Feels well.  No n/v.  Tolerating solid food, good appetite.  Stools are brown.   OBJECTIVE:         Vital signs in last 24 hours:    Temp:  [97.6 F (36.4 C)-98.4 F (36.9 C)] 97.9 F (36.6 C) (07/28 1145) Pulse Rate:  [59-117] 68 (07/28 1145) Resp:  [18-19] 18 (07/28 1145) BP: (111-149)/(70-101) 139/92 (07/28 1145) SpO2:  [97 %-100 %] 98 % (07/28 1145) Last BM Date: 05/31/21 Filed Weights   05/29/21 2044 05/31/21 0500  Weight: 81.6 kg 82 kg   General: Spoke with patient who is in the bathroom urinating.  Looks well. Heart: RRR. Chest: No labored breathing or cough Abdomen: Not reexamined Extremities: No CCE Neuro/Psych: Oriented x3.  No tremors.  No gross weakness.  Speech fluid.  Intake/Output from previous day: 07/27 0701 - 07/28 0700 In: 600 [I.V.:250; IV Piggyback:350] Out: 0   Intake/Output this shift: No intake/output data recorded.  Lab Results: Recent Labs    05/31/21 0105  WBC 10.0  HGB 14.1  HCT 39.8  PLT 209   BMET Recent Labs    05/31/21 0105 06/01/21 0259 06/02/21 0307  NA 136 137 134*  K 4.1 4.2 3.6  CL 102 103 102  CO2 28 28 23   GLUCOSE 141* 113* 122*  BUN 13 12 14   CREATININE 1.00 1.09 1.03  CALCIUM 7.8* 8.6* 8.6*   LFT Recent Labs    05/31/21 0105 06/01/21 0259 06/02/21 0307  PROT 5.1* 5.6* 5.5*  ALBUMIN 2.7* 3.0* 2.9*  AST 986* 1,120* 862*  ALT 600* 706* 678*  ALKPHOS 42 44 40  BILITOT 0.9 0.9 0.4   PT/INR No results for input(s): LABPROT, INR in the last 72 hours. Hepatitis Panel Recent Labs    05/31/21 0105  HEPBSAG NON REACTIVE  HCVAB NON REACTIVE  HEPAIGM NON REACTIVE  HEPBIGM NON REACTIVE    Studies/Results: ECHO TEE  Result Date: 06/01/2021    TRANSESOPHOGEAL ECHO REPORT   Patient Name:   KYNGSTON PICKELSIMER Date of Exam: 06/01/2021 Medical  Rec #:  Milagros Reap      Height:       69.0 in Accession #:    06/03/2021     Weight:       180.8 lb Date of Birth:  08-Oct-1986     BSA:          1.979 m Patient Age:    34 years       BP:           117/78 mmHg Patient Gender: M              HR:           77 bpm. Exam Location:  Inpatient Procedure: Transesophageal Echo, 3D Echo, Cardiac Doppler and Color Doppler Indications:    Stroke  History:        Patient has prior history of Echocardiogram examinations.  Sonographer:    [id number] RDCS Referring Phys: 10/27/1986 ANGELA NICOLE DUKE PROCEDURE: After discussion of the risks and benefits of a TEE, an informed consent was obtained from the patient. The transesophogeal probe was passed without difficulty through the esophogus of the patient. Imaged were obtained with the patient in a left lateral decubitus position. Sedation performed by different physician. The patient was monitored  while under deep sedation. Anesthestetic sedation was provided intravenously by Anesthesiology: 358.3mg  of Propofol. Image quality was good. The patient's vital signs; including heart rate, blood pressure, and oxygen saturation; remained stable throughout the procedure. The patient developed no complications during the procedure. IMPRESSIONS  1. There is mild thickening and fibrinous stranding noted on the atrial side of the posterior mitral valve leaflet. The fibrinous stranding appears to have independent motion from the leaflet itself. Given clinical picture, findings concerning for early  vegetation formation/infective endocarditis. Less likely degenerative changes of the mitral vale. There is mild mitral valve regurgitation.  2. Left ventricular ejection fraction, by estimation, is 60 to 65%. The left ventricle has normal function.  3. Right ventricular systolic function is normal. The right ventricular size is normal.  4. No left atrial/left atrial appendage thrombus was detected.  5. The aortic valve is tricuspid. Aortic valve  regurgitation is trivial. No vegetation visualized.  6. Agitated saline contrast was given intravenously to evaluate for intracardiac shunting. A single bubble is visualized after 4 cardiac cycles on both rest and with valsalva. The bubbles appear to be coming from the pulmonary veins suggestive of an intrapulmonary shunt over PFO. If high clinical suspicion, could consider transcranial doppler for further work-up. FINDINGS  Left Ventricle: Left ventricular ejection fraction, by estimation, is 60 to 65%. The left ventricle has normal function. The left ventricular internal cavity size was normal in size. Right Ventricle: The right ventricular size is normal. No increase in right ventricular wall thickness. Right ventricular systolic function is normal. Left Atrium: Left atrial size was normal in size. No left atrial/left atrial appendage thrombus was detected. Right Atrium: Right atrial size was normal in size. Pericardium: There is no evidence of pericardial effusion. Mitral Valve: There is mild thickening and fibrinous stranding noted on the atrial side of the posterior mitral valve leaflet. The fibrinous stranding appears to have independent motion from the leaflet itself. 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 Less likely degenerative changes of the mitral vale. The mitral valve is abnormal. Mild mitral valve regurgitation. Tricuspid Valve: The tricuspid valve is normal in structure. Tricuspid valve regurgitation is trivial. There is no evidence of tricuspid valve vegetation. Aortic Valve: The aortic valve is tricuspid. Aortic valve regurgitation is trivial. There is no evidence of aortic valve vegetation. Pulmonic Valve: The pulmonic valve was normal in structure. Pulmonic valve regurgitation is trivial. There is no evidence of pulmonic valve vegetation. Aorta: The aortic root and ascending aorta are structurally normal, with no evidence of  dilitation. IAS/Shunts: No atrial level shunt detected by color flow Doppler. Agitated saline contrast was given intravenously to evaluate for intracardiac shunting. A single bubble is visualized after 4 cardiac cycles on both rest and valsalva studies. The bubbles appear to be coming from the pulmonary veins suggestive of an intrapulmonary shunt over PFO. If high clinical suspicion, could consider transcranial doppler for further work-up. MD Electronically signed by Laurance Flatten MD Signature Date/Time: 06/01/2021/4:44:40 PM    Final    06/03/2021 LIVER DOPPLER  Result Date: 05/31/2021 CLINICAL DATA:  Elevated liver enzymes and history of drug and alcohol abuse. EXAM: DUPLEX ULTRASOUND OF LIVER TECHNIQUE: Color and duplex Doppler ultrasound was performed to evaluate the hepatic in-flow and out-flow vessels. COMPARISON:  None. FINDINGS: Portal Vein Velocities Main:  27-31 cm/sec Right:  23 cm/sec Left:  19 cm/sec Normally patent portal vein with no evidence of thrombus. Direction of portal vein flow is towards  the liver. Portal waveforms are within normal limits. Hepatic Vein Velocities Right:  22 cm/sec Middle:  25 cm/sec Left:  17 cm/sec Normal patent vein waveforms. No evidence of hepatic veno-occlusive disease. Hepatic Artery Velocity:  32 cm/sec Splenic Vein Velocity:  14 cm/sec Varices: None visualized. Ascites: None visualized. The spleen is normal in size. The liver parenchyma is heterogeneous with increased parenchymal echogenicity. Findings are consistent with at least steatosis. IMPRESSION: 1. No evidence of portal vein thrombosis or significant portal hypertension by hepatic duplex ultrasound. 2. Echogenic liver parenchyma consistent with at least steatosis. Electronically Signed   By: Irish Lack M.D.   On: 05/31/2021 16:39    Scheduled Meds:  Chlorhexidine Gluconate Cloth  6 each Topical Daily   heparin injection (subcutaneous)  5,000 Units Subcutaneous Q8H   Continuous  Infusions:  sodium chloride 150 mL/hr at 06/02/21 0924   cefTRIAXone (ROCEPHIN)  IV 2 g (06/01/21 1740)   vancomycin 1,250 mg (06/02/21 0517)   PRN Meds:.ketorolac, ondansetron   ASSESMENT:     Elevated LFTs, improving.   Liver dopplers unremarkable. Ultrasound w at least fatty liver.     Taking "lots" of acetaminophen for back pain and heavy ETOH consumption PTA. Acute hepatitis serologies negative.   Ceruloplasmin wnl 24, ferritin normal, A1AT normal 164, iron wnl, low TIBC.  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      Infectious endocarditis?  ID following.  Empiric abx in place.     PLAN     Ordered ANA, for some reason was not ordered yesterday as planned.  Trend LFTs.      Jennye Moccasin  06/02/2021, 1:18 PM Phone [phone]    Attending physician's note   I have taken an interval history, reviewed the chart and examined the patient. I agree with the Advanced Practitioner's note, impression and recommendations.   Excellent note from Dr. Tomasa Rand reviewed Further liver work-up in progress. Liver biopsy if there is no obvious etiology.  Edman Circle, MD [facility] [phone]
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
## 2021-06-02 NOTE — Progress Notes (Signed)
SLP Cancellation Note  Patient Details Name: Anthony Ward MRN: [id number] DOB: 1986-09-26   Cancelled treatment:       Reason Eval/Treat Not Completed: Other (comment). Pt passed stroke swallow screen.  He is on a regular diet. Spoke with Marylene Land, RN, who reports no difficulty observed.  No SLP swallow eval warranted per protocol.  If pt needs a speech/language assessment, please order (staff reports functional communication in primary language).  Anthony Ward L. Anthony Frederic, MA CCC/SLP Acute Rehabilitation Services Office number [phone] Pager [phone]    Anthony Ward 06/02/2021, 9:48 AM

## 2021-06-02 NOTE — TOC Initial Note (Signed)
Transition of Care [facility]) - Initial/Assessment Note    Patient Details  Name: Anthony Ward MRN: [id number] Date of Birth: Sep 22, 1986  Transition of Care [facility]) CM/SW Contact:    Pollie Friar, RN Phone Number: 06/02/2021, 1:22 PM  Clinical Narrative:                 Patient is here from [place] visiting his family d/t a family member passing. CM met with the patient and he is unsure of the duration of time he will be in [place]. ID seeing for potential IV abx at d/c.  Pt without a PCP in [place]. Pt positive for cocaine on admission. CM has asked Pam with [employer] to follow for assistance in arranging IV abx if needed.  TOC following.  Expected Discharge Plan: [place] Barriers to Discharge: Continued Medical Work up, Inadequate or no insurance   Patient Goals and CMS Choice        Expected Discharge Plan and Services Expected Discharge Plan: [place]   Discharge Planning Services: CM Consult                                          Prior Living Arrangements/Services   Lives with:: Self Patient language and need for interpreter reviewed:: Yes Do you feel safe going back to the place where you live?: Yes        Care giver support system in place?: No (comment)   Criminal Activity/Legal Involvement Pertinent to Current Situation/Hospitalization: No - Comment as needed  Activities of Daily Living      Permission Sought/Granted                  Emotional Assessment Appearance:: Appears stated age Attitude/Demeanor/Rapport: Engaged Affect (typically observed): Accepting Orientation: : Oriented to Self, Oriented to Place, Oriented to  Time, Oriented to Situation Alcohol / Substance Use: Illicit Drugs Psych Involvement: No (comment)  Admission diagnosis:  Transaminitis [R74.01] Polysubstance abuse (HCC) [F19.10] Acute encephalopathy [G93.40] AKI (acute kidney injury) ([place]) [N17.9] Intraparenchymal hemorrhage of  brain (HCC) [I61.9] Altered mental status, unspecified altered mental status type [R41.82] Aspiration pneumonia of both lungs, unspecified aspiration pneumonia type, unspecified part of lung ([place]) [J69.0] Patient Active Problem List   Diagnosis Date Noted   Elevated liver enzymes    Alcoholic fatty liver    Elevated LFTs 05/30/2021   ARF (acute renal failure) ([place]) 05/30/2021   Cerebral thrombosis with cerebral infarction 05/30/2021   Aspiration pneumonia of both lungs (HCC)    Intraparenchymal hemorrhage of brain (HCC)    Polysubstance abuse (HCC)    Transaminitis    Anterior cerebral circulation hemorrhagic infarction ([place])    Acute encephalopathy 05/29/2021   PCP:  Pcp, No Pharmacy:   [facility] [place], [address] [place] [address] Phone: [phone] Fax: [phone]     Social Determinants of Health (SDOH) Interventions    Readmission Risk Interventions No flowsheet data found.

## 2021-06-03 ENCOUNTER — Other Ambulatory Visit (HOSPITAL_COMMUNITY): Payer: Self-pay

## 2021-06-03 LAB — COMPREHENSIVE METABOLIC PANEL
ALT: 605 U/L — ABNORMAL HIGH (ref 0–44)
AST: 565 U/L — ABNORMAL HIGH (ref 15–41)
Albumin: 2.9 g/dL — ABNORMAL LOW (ref 3.5–5.0)
Alkaline Phosphatase: 38 U/L (ref 38–126)
Anion gap: 7 (ref 5–15)
BUN: 14 mg/dL (ref 6–20)
CO2: 23 mmol/L (ref 22–32)
Calcium: 8.9 mg/dL (ref 8.9–10.3)
Chloride: 105 mmol/L (ref 98–111)
Creatinine, Ser: 0.97 mg/dL (ref 0.61–1.24)
GFR, Estimated: >60 mL/min (ref >60)
Glucose, Bld: 112 mg/dL — ABNORMAL HIGH (ref 70–99)
Potassium: 3.9 mmol/L (ref 3.5–5.1)
Sodium: 135 mmol/L (ref 135–145)
Total Bilirubin: 1 mg/dL (ref 0.3–1.2)
Total Protein: 5.5 g/dL — ABNORMAL LOW (ref 6.5–8.1)

## 2021-06-03 LAB — GLUCOSE, CAPILLARY
Glucose-Capillary: 104 mg/dL — ABNORMAL HIGH (ref 70–99)
Glucose-Capillary: 153 mg/dL — ABNORMAL HIGH (ref 70–99)
Glucose-Capillary: 109 mg/dL — ABNORMAL HIGH (ref 70–99)

## 2021-06-03 LAB — CK: Total CK: 7268 U/L — ABNORMAL HIGH (ref 49–397)

## 2021-06-03 MED ORDER — METHOCARBAMOL 500 MG PO TABS: 500.0000 mg | ORAL_TABLET | Freq: Four times a day (QID) | ORAL | Status: DC | PRN

## 2021-06-03 MED ORDER — IBUPROFEN 200 MG PO TABS
600.0000 mg | ORAL_TABLET | Freq: Four times a day (QID) | ORAL | Status: DC | PRN
  Administered 2021-06-03: 600 mg via ORAL
  Filled 2021-06-03: qty 3

## 2021-06-03 MED ORDER — DOXYCYCLINE HYCLATE 100 MG PO TABS
100.0000 mg | ORAL_TABLET | Freq: Two times a day (BID) | ORAL | 0 refills | Status: AC
  Filled 2021-06-03: qty 60, 30d supply, fill #0

## 2021-06-03 MED ORDER — ORITAVANCIN DIPHOSPHATE 400 MG IV SOLR
1200.0000 mg | Freq: Once | INTRAVENOUS | Status: AC
  Administered 2021-06-03: 1200 mg via INTRAVENOUS
  Filled 2021-06-03: qty 120

## 2021-06-03 MED ORDER — CIPROFLOXACIN HCL 0.3 % OP SOLN
1.0000 [drp] | OPHTHALMIC | Status: DC
  Administered 2021-06-03 – 2021-06-04 (×5): 1 [drp] via OPHTHALMIC
  Filled 2021-06-03: qty 2.5

## 2021-06-03 MED ORDER — LEVOFLOXACIN 750 MG PO TABS
750.0000 mg | ORAL_TABLET | Freq: Every day | ORAL | 0 refills | Status: AC
  Filled 2021-06-03: qty 30, 30d supply, fill #0

## 2021-06-03 NOTE — Anesthesia Postprocedure Evaluation (Signed)
Anesthesia Post Note  Patient: Anthony Ward  Procedure(s) Performed: TRANSESOPHAGEAL ECHOCARDIOGRAM (TEE) BUBBLE STUDY     Patient location during evaluation: Endoscopy Anesthesia Type: General Level of consciousness: awake and alert Pain management: pain level controlled Vital Signs Assessment: post-procedure vital signs reviewed and stable Respiratory status: spontaneous breathing, nonlabored ventilation, respiratory function stable and patient connected to nasal cannula oxygen Cardiovascular status: stable and blood pressure returned to baseline Postop Assessment: no apparent nausea or vomiting Anesthetic complications: no   No notable events documented.  Last Vitals:  Vitals:   06/03/21 0812 06/03/21 1209  BP: 127/79 127/73  Pulse: 78 71  Resp: 18 16  Temp: 36.8 C 36.5 C  SpO2: 97% 98%    Last Pain:  Vitals:   06/03/21 1209  TempSrc: Oral  PainSc:                  Nelle Don Aanya Haynes

## 2021-06-03 NOTE — TOC Transition Note (Signed)
Transition of Care [facility]) - CM/SW Discharge Note   Patient Details  Name: Anthony Ward MRN: [id number] Date of Birth: June 12, 1986  Transition of [facility]) CM/SW Contact:  Kermit Balo, RN Phone Number: 06/03/2021, 2:44 PM   Clinical Narrative:    Patient will d/c home tomorrow. He plans on staying in [place] until his family members funeral then he will return to [place].  Pt will need to f/u with a PCP once he returns to [place].  Pt's medications for d/c were sent to the [facility] pharmacy and pt has paid the cost.  Pt has transportation home when d/ced.    Final next level of care: Home/Self Care Barriers to Discharge: Inadequate or no insurance, Barriers Unresolved (comment)   Patient Goals and CMS Choice        Discharge Placement                       Discharge Plan and Services   Discharge Planning Services: CM Consult                                 Social Determinants of Health (SDOH) Interventions     Readmission Risk Interventions No flowsheet data found.

## 2021-06-03 NOTE — Progress Notes (Addendum)
Daily Rounding Note  06/03/2021, 11:18 AM  LOS: 4 days   SUBJECTIVE:   Chief complaint: Elevated LFTs. Feeling better, eating 100% of solid meals.  Yellow colored formed stools.  No weakness or dizziness.  C/O pain in low back, present PTA  OBJECTIVE:         Vital signs in last 24 hours:    Temp:  [97.5 F (36.4 C)-98.2 F (36.8 C)] 98.2 F (36.8 C) (07/29 0812) Pulse Rate:  [61-78] 78 (07/29 0812) Resp:  [18-20] 18 (07/29 0812) BP: (127-139)/(79-98) 127/79 (07/29 0812) SpO2:  [97 %-100 %] 97 % (07/29 0812) Last BM Date: 05/31/21 Filed Weights   05/29/21 2044 05/31/21 0500  Weight: 81.6 kg 82 kg   General: looks moderately ill.  No scleral icterus   Heart: RRR in 60s on tele Chest: clear bil.  No cough or dyspnea Abdomen: soft, NT, ND.  Active BS  Extremities: no CCE Derm:  extensive professianal  quality tatoos Neuro/Psych:  oriented x 3.  No tremors.  Ambulated w/O difficulty.    Intake/Output from previous day: 07/28 0701 - 07/29 0700 In: 2606.2 [P.O.:720; I.V.:1886.2] Out: -   Intake/Output this shift: No intake/output data recorded.  Lab Results: No results for input(s): WBC, HGB, HCT, PLT in the last 72 hours. BMET Recent Labs    06/01/21 0259 06/02/21 0307 06/03/21 0336  NA 137 134* 135  K 4.2 3.6 3.9  CL 103 102 105  CO2 28 23 23   GLUCOSE 113* 122* 112*  BUN 12 14 14   CREATININE 1.09 1.03 0.97  CALCIUM 8.6* 8.6* 8.9   LFT Recent Labs    06/01/21 0259 06/02/21 0307 06/03/21 0336  PROT 5.6* 5.5* 5.5*  ALBUMIN 3.0* 2.9* 2.9*  AST 1,120* 862* 565*  ALT 706* 678* 605*  ALKPHOS 44 40 38  BILITOT 0.9 0.4 1.0   PT/INR No results for input(s): LABPROT, INR in the last 72 hours. Hepatitis Panel No results for input(s): HEPBSAG, HCVAB, HEPAIGM, HEPBIGM in the last 72 hours.  Studies/Results: ECHO TEE  Result Date: 06/01/2021    TRANSESOPHOGEAL ECHO REPORT   Patient Name:   TAUNO FALOTICO Date of Exam: 06/01/2021 Medical Rec #:  Milagros Reap      Height:       69.0 in Accession #:    06/03/2021     Weight:       180.8 lb Date of Birth:  May 29, 1986     BSA:          1.979 m Patient Age:    34 years       BP:           117/78 mmHg Patient Gender: M              HR:           77 bpm. Exam Location:  Inpatient Procedure: Transesophageal Echo, 3D Echo, Cardiac Doppler and Color Doppler Indications:    Stroke  History:        Patient has prior history of Echocardiogram examinations.  Sonographer:    [id number] RDCS Referring Phys: 10/27/1986 ANGELA NICOLE DUKE PROCEDURE: After discussion of the risks and benefits of a TEE, an informed consent was obtained from the patient. The transesophogeal probe was passed without difficulty through the esophogus of the patient. Imaged were obtained with the patient in a left lateral decubitus position. Sedation performed by different physician. The patient was monitored while  under deep sedation. Anesthestetic sedation was provided intravenously by Anesthesiology: 358.3mg  of Propofol. Image quality was good. The patient's vital signs; including heart rate, blood pressure, and oxygen saturation; remained stable throughout the procedure. The patient developed no complications during the procedure. IMPRESSIONS  1. There is mild thickening and fibrinous stranding noted on the atrial side of the posterior mitral valve leaflet. The fibrinous stranding appears to have independent motion from the leaflet itself. Given clinical picture, findings concerning for early  vegetation formation/infective endocarditis. Less likely degenerative changes of the mitral vale. There is mild mitral valve regurgitation.  2. Left ventricular ejection fraction, by estimation, is 60 to 65%. The left ventricle has normal function.  3. Right ventricular systolic function is normal. The right ventricular size is normal.  4. No left atrial/left atrial appendage thrombus was detected.  5. The  aortic valve is tricuspid. Aortic valve regurgitation is trivial. No vegetation visualized.  6. Agitated saline contrast was given intravenously to evaluate for intracardiac shunting. A single bubble is visualized after 4 cardiac cycles on both rest and with valsalva. The bubbles appear to be coming from the pulmonary veins suggestive of an intrapulmonary shunt over PFO. If high clinical suspicion, could consider transcranial doppler for further work-up. FINDINGS  Left Ventricle: Left ventricular ejection fraction, by estimation, is 60 to 65%. The left ventricle has normal function. The left ventricular internal cavity size was normal in size. Right Ventricle: The right ventricular size is normal. No increase in right ventricular wall thickness. Right ventricular systolic function is normal. Left Atrium: Left atrial size was normal in size. No left atrial/left atrial appendage thrombus was detected. Right Atrium: Right atrial size was normal in size. Pericardium: There is no evidence of pericardial effusion. Mitral Valve: There is mild thickening and fibrinous stranding noted on the atrial side of the posterior mitral valve leaflet. The fibrinous stranding appears to have independent motion from the leaflet itself. 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 Less likely degenerative changes of the mitral vale. The mitral valve is abnormal. Mild mitral valve regurgitation. Tricuspid Valve: The tricuspid valve is normal in structure. Tricuspid valve regurgitation is trivial. There is no evidence of tricuspid valve vegetation. Aortic Valve: The aortic valve is tricuspid. Aortic valve regurgitation is trivial. There is no evidence of aortic valve vegetation. Pulmonic Valve: The pulmonic valve was normal in structure. Pulmonic valve regurgitation is trivial. There is no evidence of pulmonic valve vegetation. Aorta: The aortic root and ascending aorta are  structurally normal, with no evidence of dilitation. IAS/Shunts: No atrial level shunt detected by color flow Doppler. Agitated saline contrast was given intravenously to evaluate for intracardiac shunting. A single bubble is visualized after 4 cardiac cycles on both rest and valsalva studies. The bubbles appear to be coming from the pulmonary veins suggestive of an intrapulmonary shunt over PFO. If high clinical suspicion, could consider transcranial doppler for further work-up. MD Electronically signed by Laurance Flatten MD Signature Date/Time: 06/01/2021/4:44:40 PM    Final     ASSESMENT:      Elevated LFTs.  Fatty liver.  Heavy EtOH and APAP consumption PTA.  Liver dopplers unremarkable. Ultrasound w at least fatty liver.  Coags, platelets normal.  Acute hepatitis serologies negative.   Ceruloplasmin wnl 24, ferritin normal, A1AT normal 164, iron wnl, low TIBC.  Smooth muscle IgG negative.  Mitochondrial IgG negative.  Markedly elevated total CK at 32, 193 >> 7268.   ANA pending ? Shock  liver in setting of rhabdo.    Elevated troponins and total CK, both downward trending.  Cards attributed this to cocaine.    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  Meningitis/encephalitis.  Essentially ruled out by benign CSF studies.  Mitral valve vegetation.  ? Endocarditis.  Vanc, Rocephin in place.    Aspiration pneumonia.  Polysubstance abuse.   PLAN     Await all pndg labs.  Follow LFTs.        Warned, advised pt to avoid ETOH in future.      Jennye Moccasin  06/03/2021, 11:18 AM Phone [phone]     Attending physician's note   I have taken an interval history, reviewed the chart and examined the patient. I agree with the Advanced Practitioner's note, impression and recommendations.   ETOH liver disease without cirrhosis. Neg WU as above. LFTs trending down Polysubstance abuse  Plan: -Continue supportive treatment for now -Avoid hepatotoxic  medications like Tylenol. -Stop cocaine. -Strictly stop drinking alcohol. -FU as outpt -Will sign off for now.  Edman Circle, MD [facility] [phone]

## 2021-06-03 NOTE — Progress Notes (Signed)
Occupational Therapy Treatment Patient Details Name: Anthony Ward MRN: [id number] DOB: Dec 25, 1985 Today's Date: 06/03/2021    History of present illness 35 year old male with history of drug abuse and HTN who was brought here for the evaluation of confusion. MRI confirmed left sided hemorrhage .MRA showed Left MCA occlusion.   OT comments  Patient is essentially at, or near, his baseline for ADL completion and in room mobility/toileting.  PT continues to follow for mobility.  All goals met in the acute setting, and no further acute OT needs identified.  He continues to limp on the R leg with mobility, but no loss of balance noted.  Patient is demonstrating good safety, and no cognitive deficits are noted.  Recommend home with intermittent supervision when cleared medically.    Follow Up Recommendations  No OT follow up    Equipment Recommendations  None recommended by OT    Recommendations for Other Services      Precautions / Restrictions Precautions Precautions: None Restrictions Weight Bearing Restrictions: No       Mobility Bed Mobility Overal bed mobility: Independent                  Transfers Overall transfer level: Modified independent               General transfer comment: pushing IV pole    Balance Overall balance assessment: Mild deficits observed, not formally tested                                         ADL either performed or assessed with clinical judgement   ADL   Eating/Feeding: Independent;Sitting   Grooming: Wash/dry hands;Wash/dry face;Set up;Standing           Upper Body Dressing : Set up;Sitting;Standing Upper Body Dressing Details (indicate cue type and reason): assisting with L UE due to IV Lower Body Dressing: Independent;Sit to/from stand   Toilet Transfer: Modified Independent;Ambulation Toilet Transfer Details (indicate cue type and reason): IV with contiued limp Toileting- Clothing Manipulation  and Hygiene: Independent;Sitting/lateral lean;Sit to/from stand       Functional mobility during ADLs: Modified independent       Vision Baseline Vision/History: No visual deficits Patient Visual Report: No change from baseline     Perception     Praxis      Cognition Arousal/Alertness: Awake/alert Behavior During Therapy: WFL for tasks assessed/performed Overall Cognitive Status: Within Functional Limits for tasks assessed                                 General Comments: patient appears back to baseline for cognition.        Exercises                  Pertinent Vitals/ Pain       Pain Assessment: No/denies pain Pain Intervention(s): Monitored during session                                                          Frequency           Progress Toward Goals  OT Goals(current goals can now be found in  the care plan section)  Progress towards OT goals: Progressing toward goals  Acute Rehab OT Goals Patient Stated Goal: to return home OT Goal Formulation: With patient Time For Goal Achievement: 06/15/21 Potential to Achieve Goals: Good  Plan All goals met and education completed, patient discharged from OT services    Co-evaluation                 AM-PAC OT "6 Clicks" Daily Activity     Outcome Measure   Help from another person eating meals?: None Help from another person taking care of personal grooming?: None Help from another person toileting, which includes using toliet, bedpan, or urinal?: None Help from another person bathing (including washing, rinsing, drying)?: None Help from another person to put on and taking off regular upper body clothing?: None Help from another person to put on and taking off regular lower body clothing?: None 6 Click Score: 24    End of Session    OT Visit Diagnosis: Unsteadiness on feet (R26.81);Muscle weakness (generalized) (M62.81);Pain   Activity Tolerance  Patient tolerated treatment well   Patient Left in bed;with call bell/phone within reach   Nurse Communication          Time: 1321-1350 OT Time Calculation (min): 29 min  Charges: OT General Charges $OT Visit: 1 Visit OT Treatments $Self Care/Home Management : 23-37 mins  06/03/2021  Rich, OTR/L  Acute Rehabilitation Services  Office:  [place] 06/03/2021, 2:00 PM

## 2021-06-03 NOTE — Progress Notes (Signed)
PROGRESS NOTE    Anthony Ward  FIE:[id number] DOB: Aug 14, 1986 DOA: 05/29/2021 PCP: Pcp, No   Chief Complain:Confusion  Brief Narrative: Patient is a 35 year old male with history of drug abuse who was brought here for the evaluation of confusion.  On presentation he was mildly febrile.  Lab work showed elevated liver enzymes, elevated troponin.  CT head showed left-sided focal hemorrhages.    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 There is also concern for possible meningitis/encephalitis for which lumbar puncture has been done and is nonreassuring.  Underwent TEE with finding of possible vegetation in the mitral valve.  Started on vancomycin, ceftriaxone.  ID, GI following  Assessment & Plan:   Principal Problem:   Acute encephalopathy Active Problems:   Elevated LFTs   ARF (acute renal failure) (HCC)   Cerebral thrombosis with cerebral infarction   Elevated liver enzymes   Alcoholic fatty liver   Altered mental status   Endocarditis of mitral valve  Left MCA stroke: Suspicion for left sided embolic stroke secondary to large vessel disease.  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 Neurology were following.  Echo showed EF of 55 to 60%, no intracardiac source of emboli.Underwent TEE Carotid Doppler did not show any significant findings. LDL of 123.HbA1c of 5.6.  Statins not started due to elevated liver enzymes. PT/OT/speech evaluation done.PT recommended outpatient PT Has minimal focal neurodeficits, he had slight weakness on the right lower extremity earlier  . Neurology signed off.  Mitral valve vegetation: TEE showed mild thickening with fibrinous material visualized on the posterior mitral valve representing early vegetation versus  redundant mitral valve.  Started on vancomycin, ceftriaxone.  Blood cultures have been negative.  Repeat blood culture sent on 7/27.  ID has been following.  Patient may not be candidate to be discharged to home with PICC line because of history of drug abuse.  ID will recommend the oral antibiotics on discharge.  Elevated liver enzymes/CK: Hepatitis panel negative. Has history of alcohol abuse.   US Doppler liver did not show significant abnormality except for steatosis. Autoimmune work-up being sent/pending.  Iron panel normal.  CK severely elevated.,likely contributing to elevated liver enzymes.  Continue IV fluid ,improving.  Suspicion for meningitis/encephalitis: Suspected on admission.  Underwent lumbar puncture.  White cell count of just 1.   CSF gram stain did not show any organism.  Total protein is within normal limit.  Clinically he does not have any evidence of meningitis/encephalitis.  Aspiration pneumonia: CT chest on presentation showed patchy areas of consolidation, likely from aspiration secondary to altered mental status.  Currently his lungs are clear and he does not have any signs of pneumonia.   Elevated troponin: Denies any chest pain.  Cardiology  were following. Troponins trended down  Hypertension: Not on home medications.  Continue to monitor blood pressure.  Continue as needed meds  for severe hypertension.  Currently blood pressure stable  Drug abuse: History of drug abuse, recently relapsed.  UDS positive for cocaine, THC.          DVT prophylaxis:SCD Code Status: Full Family Communication: Discussed with sister on phone on 7/29 Status is: Inpatient  Remains inpatient appropriate because: Ongoing work-up  Dispo: The patient is from: Home              Anticipated d/c is to: Home  Patient currently is not medically stable to d/c.   Difficult to place patient No     Consultants: Neurology, cardiology  Procedures: LP  Antimicrobials:   Anti-infectives (From admission, onward)    Start     Dose/Rate Route Frequency Ordered Stop   06/01/21 1800  cefTRIAXone (ROCEPHIN) 2 g in sodium chloride 0.9 % 100 mL IVPB        2 g 200 mL/hr over 30 Minutes Intravenous Every 24 hours 06/01/21 1603     06/01/21 1800  vancomycin (VANCOREADY) IVPB 1250 mg/250 mL        1,250 mg 166.7 mL/hr over 90 Minutes Intravenous Every 12 hours 06/01/21 1704     05/30/21 2130  acyclovir (ZOVIRAX) 815 mg in dextrose 5 % 150 mL IVPB  Status:  Discontinued        10 mg/kg  81.6 kg 166.3 mL/hr over 60 Minutes Intravenous Every 8 hours 05/30/21 1329 05/31/21 0803   05/30/21 1600  acyclovir (ZOVIRAX) 750 mg in dextrose 5 % 150 mL IVPB  Status:  Discontinued        750 mg 165 mL/hr over 60 Minutes Intravenous Every 12 hours 05/30/21 0158 05/30/21 1043   05/30/21 1300  acyclovir (ZOVIRAX) 815 mg in dextrose 5 % 150 mL IVPB  Status:  Discontinued        10 mg/kg  81.6 kg 166.3 mL/hr over 60 Minutes Intravenous Every 8 hours 05/30/21 1043 05/30/21 1329   05/30/21 1100  vancomycin (VANCOREADY) IVPB 750 mg/150 mL  Status:  Discontinued        750 mg 150 mL/hr over 60 Minutes Intravenous Every 12 hours 05/29/21 2114 05/31/21 0803   05/30/21 0800  cefTRIAXone (ROCEPHIN) 2 g in sodium chloride 0.9 % 100 mL IVPB  Status:  Discontinued        2 g 200 mL/hr over 30 Minutes Intravenous Every 12 hours 05/30/21 0147 05/31/21 0803   05/30/21 0200  ampicillin (OMNIPEN) 2 g in sodium chloride 0.9 % 100 mL IVPB  Status:  Discontinued        2 g 300 mL/hr over 20 Minutes Intravenous Every 6 hours 05/30/21 0147 05/31/21 0803   05/30/21 0200  acyclovir (ZOVIRAX) 750 mg in dextrose 5 % 150 mL IVPB        750 mg 165 mL/hr over 60 Minutes Intravenous Once 05/30/21 0158 05/30/21 0606   05/29/21 2130  vancomycin (VANCOREADY) IVPB 500 mg/100 mL       See Hyperspace for full Linked Orders Report.   500 mg 100 mL/hr over 60 Minutes Intravenous  Once 05/29/21 2018 05/30/21 0423    05/29/21 2030  vancomycin (VANCOCIN) IVPB 1000 mg/200 mL premix       See Hyperspace for full Linked Orders Report.   1,000 mg 200 mL/hr over 60 Minutes Intravenous  Once 05/29/21 2018 05/29/21 2224   05/29/21 2015  cefTRIAXone (ROCEPHIN) 2 g in sodium chloride 0.9 % 100 mL IVPB        2 g 200 mL/hr over 30 Minutes Intravenous  Once 05/29/21 2006 05/29/21 2038       Subjective: Patient seen and examined the bedside this morning.  Hemodynamically stable.  Denies any complaints today. Objective: Vitals:   06/02/21 1557 06/02/21 1954 06/03/21 0004 06/03/21 0343  BP: (!) 131/98 135/86 132/85 128/79  Pulse: 68 66 69 61  Resp: 20 18 18 19   Temp: 98.2 F (36.8 C) 98 F (36.7 C) 97.8 F (36.6 C) (!) 97.5 F (  36.4 C)  TempSrc: Oral Oral Oral Temporal  SpO2: 100% 98% 98% 99%  Weight:      Height:        Intake/Output Summary (Last 24 hours) at 06/03/2021 0733 Last data filed at 06/03/2021 0300 Gross per 24 hour  Intake 2606.21 ml  Output --  Net 2606.21 ml   Filed Weights   05/29/21 2044 05/31/21 0500  Weight: 81.6 kg 82 kg    Examination: General exam: Overall comfortable, not in distress HEENT: PERRL Respiratory system:  no wheezes or crackles  Cardiovascular system: S1 & S2 heard, RRR.  Gastrointestinal system: Abdomen is nondistended, soft and nontender. Central nervous system: Alert and oriented Extremities: No edema, no clubbing ,no cyanosis Skin: No rashes, no ulcers,no icterus  ,tattos   Data Reviewed: I have personally reviewed following labs and imaging studies  CBC: Recent Labs  Lab 05/29/21 1835 05/29/21 1901 05/29/21 1902 05/30/21 0306 05/31/21 0105  WBC 9.4  --   --  8.7 10.0  NEUTROABS 7.9*  --   --  7.7 7.9*  HGB 15.4 18.4* 18.7* 15.2 14.1  HCT 43.9 54.0* 55.0* 43.8 39.8  MCV 85.4  --   --  87.4 85.4  PLT 225  --   --  187 209   Basic Metabolic Panel: Recent Labs  Lab 05/30/21 0306 05/31/21 0105 06/01/21 0259 06/02/21 0307  06/03/21 0336  NA 133* 136 137 134* 135  K 4.6 4.1 4.2 3.6 3.9  CL 99 102 103 102 105  CO2 23 28 28 23 23   GLUCOSE 128* 141* 113* 122* 112*  BUN 19 13 12 14 14   CREATININE 1.43* 1.00 1.09 1.03 0.97  CALCIUM 7.9* 7.8* 8.6* 8.6* 8.9   GFR: Estimated Creatinine Clearance: 107.3 mL/min (by C-G formula based on SCr of 0.97 mg/dL). Liver Function Tests: Recent Labs  Lab 05/30/21 0306 05/31/21 0105 06/01/21 0259 06/02/21 0307 06/03/21 0336  AST 1,564* 986* 1,120* 862* 565*  ALT 730* 600* 706* 678* 605*  ALKPHOS 49 42 44 40 38  BILITOT 1.1 0.9 0.9 0.4 1.0  PROT 6.3* 5.1* 5.6* 5.5* 5.5*  ALBUMIN 3.4* 2.7* 3.0* 2.9* 2.9*   Recent Labs  Lab 05/29/21 1835  LIPASE 27   Recent Labs  Lab 05/29/21 1849  AMMONIA 30   Coagulation Profile: Recent Labs  Lab 05/29/21 1835 05/30/21 0306  INR 1.0  1.0 1.1   Cardiac Enzymes: Recent Labs  Lab 06/01/21 1141 06/03/21 0336  CKTOTAL 32,193* 7,268*   BNP (last 3 results) No results for input(s): PROBNP in the last 8760 hours. HbA1C: No results for input(s): HGBA1C in the last 72 hours.  CBG: Recent Labs  Lab 06/01/21 1252 06/02/21 0053 06/02/21 0617 06/02/21 1145 06/03/21 0650  GLUCAP 106* 136* 104* 135* 104*   Lipid Profile: No results for input(s): CHOL, HDL, LDLCALC, TRIG, CHOLHDL, LDLDIRECT in the last 72 hours.  Thyroid Function Tests: No results for input(s): TSH, T4TOTAL, FREET4, T3FREE, THYROIDAB in the last 72 hours. Anemia Panel: Recent Labs    06/01/21 1141  FERRITIN 261  TIBC 210*  IRON 53   Sepsis Labs: Recent Labs  Lab 05/29/21 1850 05/29/21 2035 05/30/21 0339  LATICACIDVEN 2.9* 2.0* 1.1    Recent Results (from the past 240 hour(s))  Culture, blood (routine x 2)     Status: None (Preliminary result)   Collection Time: 05/29/21  6:35 PM   Specimen: Left Antecubital; Blood  Result Value Ref Range Status   Specimen Description  Final    [facility]OD [facility], [address] [place], [place] [address]    Special Requests   Final    BOTTLES DRAWN AEROBIC AND ANAEROBIC Blood Culture adequate volume Performed at [facility], [address] [place], [place] [address]    Culture   Final    NO GROWTH 3 DAYS Performed at [facility], [address] [place], [place] [address]    Report Status PENDING  Incomplete  Culture, blood (routine x 2)     Status: None (Preliminary result)   Collection Time: 05/29/21  6:35 PM   Specimen: BLOOD RIGHT HAND  Result Value Ref Range Status   Specimen Description   Final    BLOOD RIGHT HAND Performed at [facility], [address], [place], [place] [address]    Special Requests   Final    BOTTLES DRAWN AEROBIC AND ANAEROBIC Blood Culture results may not be optimal due to an excessive volume of blood received in culture bottles Performed at [facility], [address] [place], [place] [address]    Culture   Final    NO GROWTH 3 DAYS Performed at [facility], [address], [place], [place] [address]    Report Status PENDING  Incomplete  Resp Panel by RT-PCR (Flu A&B, Covid) Nasopharyngeal Swab     Status: None   Collection Time: 05/29/21  8:32 PM   Specimen: Nasopharyngeal Swab; Nasopharyngeal(NP) swabs in vial transport medium  Result Value Ref Range Status   SARS Coronavirus 2 by RT PCR NEGATIVE NEGATIVE Final    Comment: (NOTE) SARS-CoV-2 target nucleic acids are NOT DETECTED.  The SARS-CoV-2 RNA is generally detectable in upper respiratory specimens during the acute phase of infection. The lowest concentration of SARS-CoV-2 viral copies this assay can detect is 138 copies/mL. A negative result does not preclude SARS-Cov-2 infection and should not be used as the sole basis for treatment or other patient management decisions. A negative result may occur with  improper specimen collection/handling, submission of specimen other than nasopharyngeal  swab, presence of viral mutation(s) within the areas targeted by this assay, and inadequate number of viral copies(<138 copies/mL). A negative result must be combined with clinical observations, patient history, and epidemiological information. The expected result is Negative.  Fact Sheet for Patients:  BloggerCourse.com  Fact Sheet for Healthcare Providers:  SeriousBroker.it  This test is no t yet approved or cleared by the [place] FDA and  has been authorized for detection and/or diagnosis of SARS-CoV-2 by FDA under an Emergency Use Authorization (EUA). This EUA will remain  in effect (meaning this test can be used) for the duration of the COVID-19 declaration under Section 564(b)(1) of the Act, 21 U.S.C.section 360bbb-3(b)(1), unless the authorization is terminated  or revoked sooner.       Influenza A by PCR NEGATIVE NEGATIVE Final   Influenza B by PCR NEGATIVE NEGATIVE Final    Comment: (NOTE) The Xpert Xpress SARS-CoV-2/FLU/RSV plus assay is intended as an aid in the diagnosis of influenza from Nasopharyngeal swab specimens and should not be used as a sole basis for treatment. Nasal washings and aspirates are unacceptable for Xpert Xpress SARS-CoV-2/FLU/RSV testing.  Fact Sheet for Patients: BloggerCourse.com  Fact Sheet for Healthcare Providers: SeriousBroker.it  This test is not yet approved or cleared by the [place] FDA and has been authorized for detection and/or diagnosis of  SARS-CoV-2 by FDA under an Emergency Use Authorization (EUA). This EUA will remain in effect (meaning this test can be used) for the duration of the COVID-19 declaration under Section 564(b)(1) of the Act, 21 U.S.C. section 360bbb-3(b)(1), unless the authorization is terminated or revoked.  Performed at [facility], [address] [place], [place] [address]   CSF  culture w Stat Gram Stain     Status: None   Collection Time: 05/30/21  7:41 AM   Specimen: CSF; Cerebrospinal Fluid  Result Value Ref Range Status   Specimen Description CSF  Final   Special Requests NONE  Final   Gram Stain   Final    WBC PRESENT, PREDOMINANTLY MONONUCLEAR NO ORGANISMS SEEN CYTOSPIN SMEAR    Culture   Final    NO GROWTH Performed at [facility], [address], [place], [place] [address]    Report Status 06/02/2021 FINAL  Final  Culture, blood (routine x 2)     Status: None (Preliminary result)   Collection Time: 06/01/21  4:31 PM   Specimen: BLOOD  Result Value Ref Range Status   Specimen Description BLOOD LEFT ANTECUBITAL  Final   Special Requests   Final    BOTTLES DRAWN AEROBIC ONLY Blood Culture adequate volume   Culture   Final    NO GROWTH < 24 HOURS Performed at [facility], [address], [place], [place] [address]    Report Status PENDING  Incomplete  Culture, blood (routine x 2)     Status: None (Preliminary result)   Collection Time: 06/01/21  4:31 PM   Specimen: BLOOD RIGHT HAND  Result Value Ref Range Status   Specimen Description BLOOD RIGHT HAND  Final   Special Requests   Final    BOTTLES DRAWN AEROBIC ONLY Blood Culture adequate volume   Culture   Final    NO GROWTH < 24 HOURS Performed at [facility], [address], [place], [place] [address]    Report Status PENDING  Incomplete         Radiology Studies: ECHO TEE  Result Date: 06/01/2021    TRANSESOPHOGEAL ECHO REPORT   Patient Name:   JERET GOYER Date of Exam: 06/01/2021 Medical Rec #:  [id number]      Height:       69.0 in Accession #:    [id number]     Weight:       180.8 lb Date of Birth:  [birth year]     BSA:          1.979 m Patient Age:    34 years       BP:           117/78 mmHg Patient Gender: M              HR:           77 bpm. Exam Location:  Inpatient Procedure: Transesophageal Echo, 3D Echo, Cardiac Doppler and Color Doppler Indications:     Stroke  History:        Patient has prior history of Echocardiogram examinations.  Sonographer:    Leta Jungling RDCS Referring Phys: [id number] ANGELA NICOLE DUKE PROCEDURE: After discussion of the risks and benefits of a TEE, an informed consent was obtained from the patient. The transesophogeal probe was passed without difficulty through the esophogus of the patient. Imaged were obtained with the patient in a left lateral decubitus position. Sedation performed by different physician. The  patient was monitored while under deep sedation. Anesthestetic sedation was provided intravenously by Anesthesiology: 358 MRp$  of Propofol. Image quality was good. The patient's vital signs; including heart rate, blood pressure, and oxygen saturation; remained stable throughout the procedure. The patient developed no complications during the procedure. IMPRESSIONS  1. There is mild thickening and fibrinous stranding noted on the atrial side of the posterior mitral valve leaflet. The fibrinous stranding appears to have independent motion from the leaflet itself. Given clinical picture, findings concerning for early  vegetation formation/infective endocarditis. Less likely degenerative changes of the mitral vale. There is mild mitral valve regurgitation.  2. Left ventricular ejection fraction, by estimation, is 60 to 65%. The left ventricle has normal function.  3. Right ventricular systolic function is normal. The right ventricular size is normal.  4. No left atrial/left atrial appendage thrombus was detected.  5. The aortic valve is tricuspid. Aortic valve regurgitation is trivial. No vegetation visualized.  6. Agitated saline contrast was given intravenously to evaluate for intracardiac shunting. A single bubble is visualized after 4 cardiac cycles on both rest and with valsalva. The bubbles appear to be coming from the pulmonary veins suggestive of an intrapulmonary shunt over PFO. If high clinical suspicion, could consider  transcranial doppler for further work-up. FINDINGS  Left Ventricle: Left ventricular ejection fraction, by estimation, is 60 to 65%. The left ventricle has normal function. The left ventricular internal cavity size was normal in size. Right Ventricle: The right ventricular size is normal. No increase in right ventricular wall thickness. Right ventricular systolic function is normal. Left Atrium: Left atrial size was normal in size. No left atrial/left atrial appendage thrombus was detected. Right Atrium: Right atrial size was normal in size. Pericardium: There is no evidence of pericardial effusion. Mitral Valve: There is mild thickening and fibrinous stranding noted on the atrial side of the posterior mitral valve leaflet. The fibrinous stranding appears to have independent motion from the leaflet itself. 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 Less likely degenerative changes of the mitral vale. The mitral valve is abnormal. Mild mitral valve regurgitation. Tricuspid Valve: The tricuspid valve is normal in structure. Tricuspid valve regurgitation is trivial. There is no evidence of tricuspid valve vegetation. Aortic Valve: The aortic valve is tricuspid. Aortic valve regurgitation is trivial. There is no evidence of aortic valve vegetation. Pulmonic Valve: The pulmonic valve was normal in structure. Pulmonic valve regurgitation is trivial. There is no evidence of pulmonic valve vegetation. Aorta: The aortic root and ascending aorta are structurally normal, with no evidence of dilitation. IAS/Shunts: No atrial level shunt detected by color flow Doppler. Agitated saline contrast was given intravenously to evaluate for intracardiac shunting. A single bubble is visualized after 4 cardiac cycles on both rest and valsalva studies. The bubbles appear to be coming from the pulmonary veins suggestive of an intrapulmonary shunt over PFO. If high clinical suspicion,  could consider transcranial doppler for further work-up. Laurance Flatten MD Electronically signed by Laurance Flatten MD Signature Date/Time: 06/01/2021/4:44:40 PM    Final         Scheduled Meds:  Chlorhexidine Gluconate Cloth  6 each Topical Daily   heparin injection (subcutaneous)  5,000 Units Subcutaneous Q8H   Continuous Infusions:  sodium chloride 150 mL/hr at 06/03/21 0539   cefTRIAXone (ROCEPHIN)  IV 2 g (06/02/21 1659)   vancomycin 1,250 mg (06/03/21 0541)     LOS: 4 days    Time spent: 25 mins.More than 50% of that  time was spent in counseling and/or coordination of care.      Burnadette Pop, MD [facility] P7/29/2022, 7:33 AM

## 2021-06-03 NOTE — Progress Notes (Signed)
Subjective: No new complaints   Antibiotics:  Anti-infectives (From admission, onward)    Start     Dose/Rate Route Frequency Ordered Stop   06/03/21 1800  Oritavancin Diphosphate (ORBACTIV) 1,200 mg in dextrose 5 % IVPB        1,200 mg 333.3 mL/hr over 180 Minutes Intravenous Once 06/03/21 1417     06/03/21 0000  levofloxacin (LEVAQUIN) 750 MG tablet        750 mg Oral Daily 06/03/21 1417 07/03/21 2359   06/03/21 0000  doxycycline (VIBRA-TABS) 100 MG tablet        100 mg Oral 2 times daily 06/03/21 1417 07/03/21 2359   06/01/21 1800  cefTRIAXone (ROCEPHIN) 2 g in sodium chloride 0.9 % 100 mL IVPB        2 g 200 mL/hr over 30 Minutes Intravenous Every 24 hours 06/01/21 1603     06/01/21 1800  vancomycin (VANCOREADY) IVPB 1250 mg/250 mL  Status:  Discontinued        1,250 mg 166.7 mL/hr over 90 Minutes Intravenous Every 12 hours 06/01/21 1704 06/03/21 1417   05/30/21 2130  acyclovir (ZOVIRAX) 815 mg in dextrose 5 % 150 mL IVPB  Status:  Discontinued        10 mg/kg  81.6 kg 166.3 mL/hr over 60 Minutes Intravenous Every 8 hours 05/30/21 1329 05/31/21 0803   05/30/21 1600  acyclovir (ZOVIRAX) 750 mg in dextrose 5 % 150 mL IVPB  Status:  Discontinued        750 mg 165 mL/hr over 60 Minutes Intravenous Every 12 hours 05/30/21 0158 05/30/21 1043   05/30/21 1300  acyclovir (ZOVIRAX) 815 mg in dextrose 5 % 150 mL IVPB  Status:  Discontinued        10 mg/kg  81.6 kg 166.3 mL/hr over 60 Minutes Intravenous Every 8 hours 05/30/21 1043 05/30/21 1329   05/30/21 1100  vancomycin (VANCOREADY) IVPB 750 mg/150 mL  Status:  Discontinued        750 mg 150 mL/hr over 60 Minutes Intravenous Every 12 hours 05/29/21 2114 05/31/21 0803   05/30/21 0800  cefTRIAXone (ROCEPHIN) 2 g in sodium chloride 0.9 % 100 mL IVPB  Status:  Discontinued        2 g 200 mL/hr over 30 Minutes Intravenous Every 12 hours 05/30/21 0147 05/31/21 0803   05/30/21 0200  ampicillin (OMNIPEN) 2 g in sodium chloride  0.9 % 100 mL IVPB  Status:  Discontinued        2 g 300 mL/hr over 20 Minutes Intravenous Every 6 hours 05/30/21 0147 05/31/21 0803   05/30/21 0200  acyclovir (ZOVIRAX) 750 mg in dextrose 5 % 150 mL IVPB        750 mg 165 mL/hr over 60 Minutes Intravenous Once 05/30/21 0158 05/30/21 0606   05/29/21 2130  vancomycin (VANCOREADY) IVPB 500 mg/100 mL       See Hyperspace for full Linked Orders Report.   500 mg 100 mL/hr over 60 Minutes Intravenous  Once 05/29/21 2018 05/30/21 0423   05/29/21 2030  vancomycin (VANCOCIN) IVPB 1000 mg/200 mL premix       See Hyperspace for full Linked Orders Report.   1,000 mg 200 mL/hr over 60 Minutes Intravenous  Once 05/29/21 2018 05/29/21 2224   05/29/21 2015  cefTRIAXone (ROCEPHIN) 2 g in sodium chloride 0.9 % 100 mL IVPB        2 g 200 mL/hr over 30 Minutes Intravenous  Once 05/29/21 2006 05/29/21 2038       Medications: Scheduled Meds:  Chlorhexidine Gluconate Cloth  6 each Topical Daily   heparin injection (subcutaneous)  5,000 Units Subcutaneous Q8H   Continuous Infusions:  sodium chloride 125 mL/hr at 06/03/21 1111   cefTRIAXone (ROCEPHIN)  IV 2 g (06/02/21 1659)   oritavancin (ORBACTIV) IVPB     PRN Meds:.ibuprofen, methocarbamol, ondansetron    Objective: Weight change:   Intake/Output Summary (Last 24 hours) at 06/03/2021 1447 Last data filed at 06/03/2021 0300 Gross per 24 hour  Intake 1886.21 ml  Output --  Net 1886.21 ml   Blood pressure 127/73, pulse 71, temperature 97.7 F (36.5 C), temperature source Oral, resp. rate 16, height 5\' 9"  (1.753 m), weight 82 kg, SpO2 98 %. Temp:  [97.5 F (36.4 C)-98.2 F (36.8 C)] 97.7 F (36.5 C) (07/29 1209) Pulse Rate:  [61-78] 71 (07/29 1209) Resp:  [16-20] 16 (07/29 1209) BP: (127-135)/(73-98) 127/73 (07/29 1209) SpO2:  [97 %-100 %] 98 % (07/29 1209)  Physical Exam: Physical Exam   CBC:    BMET Recent Labs    06/02/21 0307 06/03/21 0336  NA 134* 135  K 3.6 3.9  CL 102  105  CO2 23 23  GLUCOSE 122* 112*  BUN 14 14  CREATININE 1.03 0.97  CALCIUM 8.6* 8.9     Liver Panel  Recent Labs    06/02/21 0307 06/03/21 0336  PROT 5.5* 5.5*  ALBUMIN 2.9* 2.9*  AST 862* 565*  ALT 678* 605*  ALKPHOS 40 38  BILITOT 0.4 1.0       Sedimentation Rate No results for input(s): ESRSEDRATE in the last 72 hours. C-Reactive Protein No results for input(s): CRP in the last 72 hours.  Micro Results: Recent Results (from the past 720 hour(s))  Culture, blood (routine x 2)     Status: None (Preliminary result)   Collection Time: 05/29/21  6:35 PM   Specimen: Left Antecubital; Blood  Result Value Ref Range Status   Specimen Description   Final    LEFT ANTECUBITAL BLOOD Performed at [facility], [address] [place], [place]    Special Requests   Final    BOTTLES DRAWN AEROBIC AND ANAEROBIC Blood Culture adequate volume Performed at [facility], [address] [place], [place]    Culture   Final    NO GROWTH 3 DAYS Performed at [facility], [address], [place], [place]    Report Status PENDING  Incomplete  Culture, blood (routine x 2)     Status: None (Preliminary result)   Collection Time: 05/29/21  6:35 PM   Specimen: BLOOD RIGHT HAND  Result Value Ref Range Status   Specimen Description   Final    BLOOD RIGHT HAND Performed at [facility], [address], [place], [place]    Special Requests   Final    BOTTLES DRAWN AEROBIC AND ANAEROBIC Blood Culture results may not be optimal due to an excessive volume of blood received in culture bottles Performed at [facility], [address] [place], [place]    Culture   Final    NO GROWTH 3 DAYS Performed at [facility], [address], [place], [place]    Report Status PENDING  Incomplete  Resp Panel by RT-PCR (Flu A&B, Covid) Nasopharyngeal Swab     Status: None  Collection  Time: 05/29/21  8:32 PM   Specimen: Nasopharyngeal Swab; Nasopharyngeal(NP) swabs in vial transport medium  Result Value Ref Range Status   SARS Coronavirus 2 by RT PCR NEGATIVE NEGATIVE Final    Comment: (NOTE) SARS-CoV-2 target nucleic acids are NOT DETECTED.  The SARS-CoV-2 RNA is generally detectable in upper respiratory specimens during the acute phase of infection. The lowest concentration of SARS-CoV-2 viral copies this assay can detect is 138 copies/mL. A negative result does not preclude SARS-Cov-2 infection and should not be used as the sole basis for treatment or other patient management decisions. A negative result may occur with  improper specimen collection/handling, submission of specimen other than nasopharyngeal swab, presence of viral mutation(s) within the areas targeted by this assay, and inadequate number of viral copies(<138 copies/mL). A negative result must be combined with clinical observations, patient history, and epidemiological information. The expected result is Negative.  Fact Sheet for Patients:  BloggerCourse.com  Fact Sheet for Healthcare Providers:  SeriousBroker.it  This test is no t yet approved or cleared by the [place] FDA and  has been authorized for detection and/or diagnosis of SARS-CoV-2 by FDA under an Emergency Use Authorization (EUA). This EUA will remain  in effect (meaning this test can be used) for the duration of the COVID-19 declaration under Section 564(b)(1) of the Act, 21 U.S.C.section 360bbb-3(b)(1), unless the authorization is terminated  or revoked sooner.       Influenza A by PCR NEGATIVE NEGATIVE Final   Influenza B by PCR NEGATIVE NEGATIVE Final    Comment: (NOTE) The Xpert Xpress SARS-CoV-2/FLU/RSV plus assay is intended as an aid in the diagnosis of influenza from Nasopharyngeal swab specimens and should not be used as a sole basis for treatment. Nasal washings  and aspirates are unacceptable for Xpert Xpress SARS-CoV-2/FLU/RSV testing.  Fact Sheet for Patients: BloggerCourse.com  Fact Sheet for Healthcare Providers: SeriousBroker.it  This test is not yet approved or cleared by the [place] FDA and has been authorized for detection and/or diagnosis of SARS-CoV-2 by FDA under an Emergency Use Authorization (EUA). This EUA will remain in effect (meaning this test can be used) for the duration of the COVID-19 declaration under Section 564(b)(1) of the Act, 21 U.S.C. section 360bbb-3(b)(1), unless the authorization is terminated or revoked.  Performed at [facility], [address] [place], [place] [address]   CSF culture w Stat Gram Stain     Status: None   Collection Time: 05/30/21  7:41 AM   Specimen: CSF; Cerebrospinal Fluid  Result Value Ref Range Status   Specimen Description CSF  Final   Special Requests NONE  Final   Gram Stain   Final    WBC PRESENT, PREDOMINANTLY MONONUCLEAR NO ORGANISMS SEEN CYTOSPIN SMEAR    Culture   Final    NO GROWTH Performed at [facility], [address], [place], [place] [address]    Report Status 06/02/2021 FINAL  Final  Culture, blood (routine x 2)     Status: None (Preliminary result)   Collection Time: 06/01/21  4:31 PM   Specimen: BLOOD  Result Value Ref Range Status   Specimen Description BLOOD LEFT ANTECUBITAL  Final   Special Requests   Final    BOTTLES DRAWN AEROBIC ONLY Blood Culture adequate volume   Culture   Final    NO GROWTH < 24 HOURS Performed at [facility], [address] [place], [place] [address]    Report  Status PENDING  Incomplete  Culture, blood (routine x 2)     Status: None (Preliminary result)   Collection Time: 06/01/21  4:31 PM   Specimen: BLOOD RIGHT HAND  Result Value Ref Range Status   Specimen Description BLOOD RIGHT HAND  Final   Special Requests   Final    BOTTLES DRAWN  AEROBIC ONLY Blood Culture adequate volume   Culture   Final    NO GROWTH < 24 HOURS Performed at [facility], [address] [place], [place] [address]    Report Status PENDING  Incomplete    Studies/Results: ECHO TEE  Result Date: 06/01/2021    TRANSESOPHOGEAL ECHO REPORT   Patient Name:   Anthony Ward Date of Exam: 06/01/2021 Medical Rec #:  [id number]      Height:       69.0 in Accession #:    [id number]     Weight:       180.8 lb Date of Birth:  Apr 25, 1986     BSA:          1.979 m Patient Age:    34 years       BP:           117/78 mmHg Patient Gender: M              HR:           77 bpm. Exam Location:  Inpatient Procedure: Transesophageal Echo, 3D Echo, Cardiac Doppler and Color Doppler Indications:    Stroke  History:        Patient has prior history of Echocardiogram examinations.  Sonographer:    Leta Jungling RDCS Referring Phys: [id number] ANGELA NICOLE DUKE PROCEDURE: After discussion of the risks and benefits of a TEE, an informed consent was obtained from the patient. The transesophogeal probe was passed without difficulty through the esophogus of the patient. Imaged were obtained with the patient in a left lateral decubitus position. Sedation performed by different physician. The patient was monitored while under deep sedation. Anesthestetic sedation was provided intravenously by Anesthesiology: 358.  of Propofol. Image quality was good. The patient's vital signs; including heart rate, blood pressure, and oxygen saturation; remained stable throughout the procedure. The patient developed no complications during the procedure. IMPRESSIONS  1. There is mild thickening and fibrinous stranding noted on the atrial side of the posterior mitral valve leaflet. The fibrinous stranding appears to have independent motion from the leaflet itself. Given clinical picture, findings concerning for early  vegetation formation/infective endocarditis. Less likely degenerative changes of the mitral  vale. There is mild mitral valve regurgitation.  2. Left ventricular ejection fraction, by estimation, is 60 to 65%. The left ventricle has normal function.  3. Right ventricular systolic function is normal. The right ventricular size is normal.  4. No left atrial/left atrial appendage thrombus was detected.  5. The aortic valve is tricuspid. Aortic valve regurgitation is trivial. No vegetation visualized.  6. Agitated saline contrast was given intravenously to evaluate for intracardiac shunting. A single bubble is visualized after 4 cardiac cycles on both rest and with valsalva. The bubbles appear to be coming from the pulmonary veins suggestive of an intrapulmonary shunt over PFO. If high clinical suspicion, could consider transcranial doppler for further work-up. FINDINGS  Left Ventricle: Left ventricular ejection fraction, by estimation, is 60 to 65%. The left ventricle has normal function. The left ventricular internal cavity size was normal in size. Right Ventricle: The right ventricular size is normal. No increase  in right ventricular wall thickness. Right ventricular systolic function is normal. Left Atrium: Left atrial size was normal in size. No left atrial/left atrial appendage thrombus was detected. Right Atrium: Right atrial size was normal in size. Pericardium: There is no evidence of pericardial effusion. Mitral Valve: There is mild thickening and fibrinous stranding noted on the atrial side of the posterior mitral valve leaflet. The fibrinous stranding appears to have independent motion from the leaflet itself. 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 Less likely degenerative changes of the mitral vale. The mitral valve is abnormal. Mild mitral valve regurgitation. Tricuspid Valve: The tricuspid valve is normal in structure. Tricuspid valve regurgitation is trivial. There is no evidence of tricuspid valve vegetation. Aortic Valve: The aortic  valve is tricuspid. Aortic valve regurgitation is trivial. There is no evidence of aortic valve vegetation. Pulmonic Valve: The pulmonic valve was normal in structure. Pulmonic valve regurgitation is trivial. There is no evidence of pulmonic valve vegetation. Aorta: The aortic root and ascending aorta are structurally normal, with no evidence of dilitation. IAS/Shunts: No atrial level shunt detected by color flow Doppler. Agitated saline contrast was given intravenously to evaluate for intracardiac shunting. A single bubble is visualized after 4 cardiac cycles on both rest and valsalva studies. The bubbles appear to be coming from the pulmonary veins suggestive of an intrapulmonary shunt over PFO. If high clinical suspicion, could consider transcranial doppler for further work-up. Laurance FlattenHeather Pemberton MD Electronically signed by Laurance FlattenHeather Pemberton MD Signature Date/Time: 06/01/2021/4:44:40 PM    Final       Assessment/Plan:    Principal Problem:   Acute encephalopathy Active Problems:   Elevated LFTs   ARF (acute renal failure) (HCC)   Cerebral thrombosis with cerebral infarction   Elevated liver enzymes   Alcoholic fatty liver   Altered mental status   Endocarditis of mitral valve    Anthony Ward is a 35 y.o. male with a history of alcohol use disorder, as well as cocaine and p.o. opioid use.  He presented with altered mental status and was found to have L sided multifocal hemorrhage in the basal ganglia and insula. As patient returned to his baseline level of mentation he noted some R sided weakness and a TEE was conducted to further evaluate his valves given the distribution of his stroke and his history drug use. He was found to have a possible vegetation on his mitral valve. His blood cultures were NGTD. He is s/p 4 days of vancomycin and ceftriaxone.   We will give patient a 1x dose of oritavancin and continue ceftriaxone while he is inpatient. On discharge patient will transition to  doxycycline and levofloxacin for a total of 4 weeks.   He will follow up in the [facility] clinic.   LOS: 4 days   Anthony Ward Internal medicine PGY 2 06/03/2021, 2:47 PM

## 2021-06-03 NOTE — TOC CAGE-AID Note (Signed)
Transition of Care [facility]) - CAGE-AID Screening   Patient Details  Name: Anthony Ward MRN: [id number] Date of Birth: 11-03-1986  Transition of [facility] CM/SW Contact:    Kermit Balo, RN Phone Number: 06/03/2021, 2:52 PM   Clinical Narrative: Pt refused inpatient/ outpatient counseling resources.   CAGE-AID Screening: Substance Abuse Screening unable to be completed due to: : Patient unable to participate (Pt is confused.)  Have You Ever Felt You Ought to Cut Down on Your Drinking or Drug Use?: Yes Have People Annoyed You By Critizing Your Drinking Or Drug Use?: No Have You Felt Bad Or Guilty About Your Drinking Or Drug Use?: No Have You Ever Had a Drink or Used Drugs First Thing In The Morning to Steady Your Nerves or to Get Rid of a Hangover?: No CAGE-AID Score: 1  Substance Abuse Education Offered: Yes (pt refused)  Substance abuse interventions: Transport planner

## 2021-06-04 LAB — COMPREHENSIVE METABOLIC PANEL
ALT: 497 U/L — ABNORMAL HIGH (ref 0–44)
AST: 322 U/L — ABNORMAL HIGH (ref 15–41)
Albumin: 2.8 g/dL — ABNORMAL LOW (ref 3.5–5.0)
Alkaline Phosphatase: 38 U/L (ref 38–126)
Anion gap: 7 (ref 5–15)
BUN: 15 mg/dL (ref 6–20)
CO2: 24 mmol/L (ref 22–32)
Calcium: 8.4 mg/dL — ABNORMAL LOW (ref 8.9–10.3)
Chloride: 106 mmol/L (ref 98–111)
Creatinine, Ser: 1.12 mg/dL (ref 0.61–1.24)
GFR, Estimated: >60 mL/min (ref >60)
Glucose, Bld: 113 mg/dL — ABNORMAL HIGH (ref 70–99)
Potassium: 3.8 mmol/L (ref 3.5–5.1)
Sodium: 137 mmol/L (ref 135–145)
Total Bilirubin: 0.8 mg/dL (ref 0.3–1.2)
Total Protein: 5.2 g/dL — ABNORMAL LOW (ref 6.5–8.1)

## 2021-06-04 LAB — CULTURE, BLOOD (ROUTINE X 2)
Culture: NO GROWTH
Culture: NO GROWTH
Special Requests: ADEQUATE

## 2021-06-04 LAB — GLUCOSE, CAPILLARY
Glucose-Capillary: 103 mg/dL — ABNORMAL HIGH (ref 70–99)
Glucose-Capillary: 114 mg/dL — ABNORMAL HIGH (ref 70–99)
Glucose-Capillary: 115 mg/dL — ABNORMAL HIGH (ref 70–99)

## 2021-06-04 LAB — CK: Total CK: 3993 U/L — ABNORMAL HIGH (ref 49–397)

## 2021-06-04 MED ORDER — METHOCARBAMOL 500 MG PO TABS
500.0000 mg | ORAL_TABLET | Freq: Three times a day (TID) | ORAL | 0 refills | Status: AC | PRN
  Filled 2021-06-04 – 2021-06-09 (×2): qty 30, 10d supply, fill #0

## 2021-06-04 MED ORDER — CIPROFLOXACIN HCL 0.3 % OP SOLN
1.0000 [drp] | OPHTHALMIC | 0 refills | Status: AC
  Filled 2021-06-04 – 2021-06-09 (×2): qty 5, 20d supply, fill #0
  Filled 2021-06-09: qty 5, 7d supply, fill #0

## 2021-06-04 NOTE — Plan of Care (Signed)
  Problem: Education: Goal: Knowledge of disease or condition will improve Outcome: Progressing Goal: Knowledge of secondary prevention will improve Outcome: Progressing Goal: Knowledge of patient specific risk factors addressed and post discharge goals established will improve Outcome: Progressing Goal: Individualized Educational Video(s) Outcome: Progressing   

## 2021-06-04 NOTE — Discharge Summary (Signed)
Physician Discharge Summary  Anthony Ward MHD:[id number] DOB: 03/04/1986 DOA: 05/29/2021  PCP: Pcp, No  Admit date: 05/29/2021 Discharge date: 06/04/2021  Admitted From: Home Disposition:  Home  Discharge Condition:Stable CODE STATUS:FULL Diet recommendation: Regular   Brief/Interim Summary:  Patient is a 35 year old male with history of drug abuse who was brought here for the evaluation of confusion.  On presentation he was mildly febrile.  Lab work showed elevated liver enzymes, elevated troponin.  CT head showed left-sided focal hemorrhages.    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 There is also concern for possible meningitis/encephalitis for which lumbar puncture has been done and is nonreassuring.  Underwent TEE with finding of possible vegetation in the mitral valve.  Started on vancomycin, ceftriaxone.  His blood cultures remained negative.  He was given a dose of oritavancin and ID recommended to discharge him on doxycycline and levofloxacin for a month.  He has an appointment with ID as an outpatient.  Following problems were addressed during his hospitalization:  Left MCA stroke: Suspicion for left sided embolic stroke secondary to large vessel disease.  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 Neurology were following.  Echo showed EF of 55 to 60%, no intracardiac source of emboli.Underwent TEE Carotid Doppler did not show any significant findings. LDL of 123.HbA1c of 5.6.  Statins not started due to elevated liver enzymes. PT/OT/speech evaluation done.PT recommended outpatient PT Has minimal focal neurodeficits, he had slight weakness on the right lower extremity earlier  . Neurology signed off. He needs to follow-up with neurology as an  outpatient in 4 weeks.  He can follow-up with neurologist at [place] or he can follow-up here with [facility] neurology if he stays here   Mitral valve vegetation: TEE showed mild thickening with fibrinous material visualized on the posterior mitral valve representing early vegetation versus redundant mitral valve.  Started on vancomycin, ceftriaxone.  Blood cultures have been negative.  Repeat blood culture sent on 7/27 negative.  ID has been following.  Patient may not be candidate to be discharged to home with PICC line because of history of drug abuse.  ID recommended the oral antibiotics on discharge.  He has an appointment with ID as an outpatient   Elevated liver enzymes/CK: Hepatitis panel negative. Has history of alcohol abuse.   US Doppler liver did not show significant abnormality except for steatosis. Autoimmune work-up being sent, they are mostly negative.  Iron panel normal.  CK severely elevated.,likely contributing to elevated liver enzymes.  CK, liver enzymes improved with IV fluids.Check LFTs in a week  Suspicion for meningitis/encephalitis: Suspected on admission.  Underwent lumbar puncture.  White cell count of just 1.   CSF gram stain did not show any organism.  Total protein is within normal limit.  Clinically he does not have any evidence of meningitis/encephalitis.   Aspiration pneumonia: CT chest on presentation showed patchy areas of consolidation, likely from aspiration secondary to altered mental status.  Currently his lungs are clear and he does not have any signs of pneumonia.   Elevated troponin: Denies any chest pain.  Cardiology  were following. Troponins trended down    Drug abuse: History of drug abuse, recently relapsed.  UDS positive for cocaine, THC.Counseled for cessation     Discharge Diagnoses:  Principal Problem:   Acute encephalopathy Active Problems:  Elevated LFTs   ARF (acute renal failure) (HCC)   Cerebral thrombosis with cerebral infarction    Elevated liver enzymes   Alcoholic fatty liver   Altered mental status   Endocarditis of mitral valve    Discharge Instructions  Discharge Instructions     Diet general   Complete by: As directed    Discharge instructions   Complete by: As directed    1)Please follow up with infectious disease on the given appointment date.  Name and number of the provider has been attached 2) follow-up with neurology as an outpatient in 4 weeks. 3)take prescribed medications as instructed 4)Follow up with the PCP in 2 weeks.  Do a liver function test during the follow-up   Increase activity slowly   Complete by: As directed       Allergies as of 06/04/2021       Reactions   Other Shortness Of Breath   Alcohol and beer.  Alcohol and beer.         Medication List     STOP taking these medications    cloNIDine 0.1 MG tablet Commonly known as: CATAPRES   dicyclomine 20 MG tablet Commonly known as: BENTYL   tiZANidine 4 MG tablet Commonly known as: ZANAFLEX       TAKE these medications    cetirizine 10 MG tablet Commonly known as: ZYRTEC Take 10 mg by mouth daily.   ciprofloxacin 0.3 % ophthalmic solution Commonly known as: CILOXAN Place 1 drop into both eyes every 4 (four) hours while awake. Administer 1 drop, every 2 hours, while awake, for 2 days. Then 1 drop, every 4 hours, while awake, for the next 5 days.   doxycycline 100 MG tablet Commonly known as: VIBRA-TABS Take 1 tablet (100 mg total) by mouth 2 (two) times daily.   levofloxacin 750 MG tablet Commonly known as: Levaquin Take 1 tablet (750 mg total) by mouth daily.   methocarbamol 500 MG tablet Commonly known as: ROBAXIN Take 1 tablet (500 mg total) by mouth every 8 (eight) hours as needed for muscle spasms (back pain).       ASK your doctor about these medications    ondansetron 4 MG disintegrating tablet Commonly known as: Zofran ODT Take 1 tablet (4 mg total) by mouth every 8 (eight) hours as  needed for nausea.        Follow-up Information     Daiva Eves, Lisette Grinder, MD. Schedule an appointment as soon as possible for a visit in 3 week(s).   Specialty: Infectious Diseases Why: You have an appointment 06/28/2021 at 1045 AM  with Dr Daiva Eves Contact information: [address] [place] [address] [phone]                Allergies  Allergen Reactions   Other Shortness Of Breath    Alcohol and beer.  Alcohol and beer.      Consultations: Iron pulmonology., GI   Procedures/Studies: CT Head Wo Contrast  Result Date: 05/29/2021 CLINICAL DATA:  Altered mental status. EXAM: CT HEAD WITHOUT CONTRAST TECHNIQUE: Contiguous axial images were obtained from the base of the skull through the vertex without intravenous contrast. COMPARISON:  None. FINDINGS: Brain: A cluster of 4 mm and 5 mm hyperdense foci are seen within the white matter of the left parietal lobe. These are near the anterior aspect of the left external capsule and adjacent to the head of the caudate nucleus on the left (axial CT images 19 through  21, CT series 2). A very small amount of surrounding white matter low attenuation is seen without evidence of significant mass effect or midline shift. No evidence of acute infarction, hydrocephalus, extra-axial collection or mass lesion/mass effect. Vascular: No hyperdense vessel or unexpected calcification. Skull: Normal. Negative for fracture or focal lesion. Sinuses/Orbits: No acute finding. Other: None. IMPRESSION: 1. Cluster of very small areas of focal hemorrhage within the white matter of the left parietal lobe, as described above. MRI correlation is recommended. Electronically Signed   By: Aram Candela M.D.   On: 05/29/2021 19:48   CT Cervical Spine Wo Contrast  Result Date: 05/29/2021 CLINICAL DATA:  Status post trauma. EXAM: CT CERVICAL SPINE WITHOUT CONTRAST TECHNIQUE: Multidetector CT imaging of the cervical spine was performed without  intravenous contrast. Multiplanar CT image reconstructions were also generated. COMPARISON:  None. FINDINGS: Alignment: Normal. Skull base and vertebrae: No acute fracture. No primary bone lesion or focal pathologic process. Soft tissues and spinal canal: No prevertebral fluid or swelling. No visible canal hematoma. Disc levels: Normal multilevel endplates are seen with normal multilevel intervertebral disc spaces. Normal, bilateral multilevel facet joints are noted. Upper chest: Negative. Other: None. IMPRESSION: Normal cervical spine CT. Electronically Signed   By: Aram Candela M.D.   On: 05/29/2021 19:50   MR ANGIO HEAD WO CONTRAST  Result Date: 05/30/2021 CLINICAL DATA:  Headache, intracranial hemorrhage suspected; Neuro deficit, acute, stroke suspected. EXAM: MRI HEAD WITH CONTRAST MRA HEAD WITHOUT CONTRAST TECHNIQUE: Multiplanar, multiecho pulse sequences of the brain and surrounding structures were obtained with intravenous contrast. Angiographic images of the head were obtained using MRA technique without contrast. CONTRAST:  7.34mL GADAVIST GADOBUTROL 1 MMOL/ML IV SOLN COMPARISON:  Noncontrast head MRI 05/30/2021 and head CT 05/29/2021 FINDINGS: MRI HEAD FINDINGS Minimal enhancement in the regions of the left basal ganglia infarcts and hemorrhage shown on the prior noncontrast MRI is vascular in appearance. No masslike or other suspicious enhancement is identified. MRA HEAD FINDINGS The included intracranial portions of the vertebral arteries are widely patent to the basilar and codominant. Patent PICA, AICA, and SCA origins are identified bilaterally with a common origin of the left PICA and AICA noted, a normal variant. The basilar artery is widely patent. There is a large left posterior communicating artery. Both PCAs are patent without evidence of a significant proximal stenosis. The internal carotid arteries are widely patent from skull base to carotid termini. The left MCA is occluded at its  origin with some surrounding collateral vessels but only at most faintly reconstituted distal flow. The right MCA and both ACAs are patent without evidence of a significant proximal right MCA or left ACA stenosis. The right A1 segment is hypoplastic with a moderate to severe stenosis proximally. No aneurysm is identified. IMPRESSION: 1. Left MCA occlusion. 2. Hypoplastic right A1 segment with moderate to severe proximal stenosis. 3. No masslike or suspicious intracranial enhancement. Electronically Signed   By: Sebastian Ache M.D.   On: 05/30/2021 10:53   MR BRAIN WO CONTRAST  Result Date: 05/30/2021 CLINICAL DATA:  Mental status change, unknown cause Bleed, vs infection vs mass EXAM: MRI HEAD WITHOUT CONTRAST TECHNIQUE: Multiplanar, multiecho pulse sequences of the brain and surrounding structures were obtained without intravenous contrast. COMPARISON:  None. FINDINGS: Brain: Multifocal hemorrhage in the left basal ganglia with surrounding edema. There are areas of diffusion restriction in the inferior left lentiform nucleus, left caudate head and left insula. No acute or chronic hemorrhage. Normal white matter signal, parenchymal volume and  CSF spaces. The midline structures are normal. Vascular: Major flow voids are preserved. Skull and upper cervical spine: Normal calvarium and skull base. Visualized upper cervical spine and soft tissues are normal. Sinuses/Orbits:No paranasal sinus fluid levels or advanced mucosal thickening. No mastoid or middle ear effusion. Normal orbits. IMPRESSION: 1. Multifocal hemorrhage and abnormal diffusion restriction in the left hemisphere, within the basal ganglia and insula. Possibilities include hypertensive type hemorrhage (possibly secondary to cocaine abuse in this case) or an infectious process. Further MR imaging with contrast is recommended. Electronically Signed   By: Deatra Robinson M.D.   On: 05/30/2021 00:44   MR BRAIN W CONTRAST  Result Date: 05/30/2021 CLINICAL  DATA:  Headache, intracranial hemorrhage suspected; Neuro deficit, acute, stroke suspected. EXAM: MRI HEAD WITH CONTRAST MRA HEAD WITHOUT CONTRAST TECHNIQUE: Multiplanar, multiecho pulse sequences of the brain and surrounding structures were obtained with intravenous contrast. Angiographic images of the head were obtained using MRA technique without contrast. CONTRAST:  7.2mL GADAVIST GADOBUTROL 1 MMOL/ML IV SOLN COMPARISON:  Noncontrast head MRI 05/30/2021 and head CT 05/29/2021 FINDINGS: MRI HEAD FINDINGS Minimal enhancement in the regions of the left basal ganglia infarcts and hemorrhage shown on the prior noncontrast MRI is vascular in appearance. No masslike or other suspicious enhancement is identified. MRA HEAD FINDINGS The included intracranial portions of the vertebral arteries are widely patent to the basilar and codominant. Patent PICA, AICA, and SCA origins are identified bilaterally with a common origin of the left PICA and AICA noted, a normal variant. The basilar artery is widely patent. There is a large left posterior communicating artery. Both PCAs are patent without evidence of a significant proximal stenosis. The internal carotid arteries are widely patent from skull base to carotid termini. The left MCA is occluded at its origin with some surrounding collateral vessels but only at most faintly reconstituted distal flow. The right MCA and both ACAs are patent without evidence of a significant proximal right MCA or left ACA stenosis. The right A1 segment is hypoplastic with a moderate to severe stenosis proximally. No aneurysm is identified. IMPRESSION: 1. Left MCA occlusion. 2. Hypoplastic right A1 segment with moderate to severe proximal stenosis. 3. No masslike or suspicious intracranial enhancement. Electronically Signed   By: Sebastian Ache M.D.   On: 05/30/2021 10:53   CT CHEST ABDOMEN PELVIS W CONTRAST  Result Date: 05/29/2021 CLINICAL DATA:  Altered mental status EXAM: CT CHEST, ABDOMEN,  AND PELVIS WITH CONTRAST TECHNIQUE: Multidetector CT imaging of the chest, abdomen and pelvis was performed following the standard protocol during bolus administration of intravenous contrast. CONTRAST:  80mL OMNIPAQUE IOHEXOL 300 MG/ML  SOLN COMPARISON:  None. FINDINGS: CT CHEST FINDINGS Cardiovascular: The aortic root is suboptimally assessed given cardiac pulsation artifact. The aorta is normal caliber. No acute luminal abnormality of the imaged aorta. No periaortic stranding or hemorrhage. Normal 3 vessel branching of the aortic arch. Proximal great vessels are unremarkable. Normal heart size. No pericardial effusion. Central pulmonary arteries are normal caliber. No large central filling defects. More distal evaluation limited by a non tailored technique. No major venous abnormalities. Mediastinum/Nodes: No mediastinal fluid or gas. Normal thyroid gland and thoracic inlet. No acute abnormality of the trachea. Mild thickening of the distal thoracic esophagus. No worrisome mediastinal, hilar or axillary adenopathy. Lungs/Pleura: Patchy areas of mixed ground-glass and consolidative opacity are present in the right upper lobe and minimally in the bilateral lower lobes with additional hypoventilatory changes/atelectasis. No pneumothorax. No layering effusion. No concerning pulmonary nodules or  masses. Musculoskeletal: No acute osseous abnormality or suspicious osseous lesion. No body wall hematoma, chest wall mass or other concerning abnormality. CT ABDOMEN PELVIS FINDINGS Hepatobiliary: No direct hepatic injury or perihepatic hematoma. Diffuse hepatic hypoattenuation compatible with hepatic steatosis. Sparing along the gallbladder fossa. No concerning focal liver lesion. Smooth liver surface contour. Pancreas: No pancreatic ductal dilatation or surrounding inflammatory changes. Spleen: Normal in size. No concerning splenic lesions. Small accessory splenule anteriorly. Adrenals/Urinary Tract: No concerning adrenal  mass or hemorrhage. Kidneys are normally located with symmetric enhancementand excretion without extravasation of contrast on the excretory delayed phase imaging. No direct renal injury or perinephric hemorrhage. No suspicious renal lesion, urolithiasis or hydronephrosis. No evidence of acute bladder injury or other bladder abnormality. Stomach/Bowel: Distal esophagus, stomach and duodenal sweep are unremarkable. No small bowel wall thickening or dilatation. No evidence of obstruction. A normal appendix is visualized. No colonic dilatation or wall thickening. No mesenteric hematoma or contusion. Vascular/Lymphatic: No significant vascular findings are present. No enlarged abdominal or pelvic lymph nodes. Reproductive: The prostate and seminal vesicles are unremarkable. No acute or worrisome abnormality of the external genitalia. Other: No body wall or retroperitoneal hematoma. No free abdominopelvic air or fluid. No traumatic abdominal wall dehiscence. No bowel containing hernia. Musculoskeletal: No acute osseous abnormality or suspicious osseous lesion. Thirteen pairs of ribs, the lowest of which appear rudimentary. Four normally formed lumbar vertebrae with a fifth transitional, partially sacralized vertebrae. IMPRESSION: No acute traumatic findings in the chest, abdomen or pelvis. Patchy areas of consolidation and ground-glass seen in the upper lobes and bilateral lower lobes, could reflect an acute infectious or inflammatory process or possible sequela of aspiration in the setting of altered mental status. Mild circumferential thickening of the distal thoracic esophagus, can be seen in the setting of emesis or reflux. Correlate with clinical findings. Hepatic steatosis. Electronically Signed   By: Kreg Shropshire M.D.   On: 05/29/2021 19:46   ECHOCARDIOGRAM COMPLETE  Result Date: 05/30/2021    ECHOCARDIOGRAM REPORT   Patient Name:   AVIRAJ KENTNER Date of Exam: 05/30/2021 Medical Rec #:  [id number]      Height:        69.0 in Accession #:    [id number]     Weight:       180.0 lb Date of Birth:  05-12-1986     BSA:          1.976 m Patient Age:    34 years       BP:           142/105 mmHg Patient Gender: M              HR:           91 bpm. Exam Location:  Inpatient Procedure: 2D Echo, Cardiac Doppler and Color Doppler Indications:    Elevated Troponin  History:        Patient has no prior history of Echocardiogram examinations.  Sonographer:    Elmarie Shiley Dance Referring Phys: [id number] ARSHAD N KAKRAKANDY IMPRESSIONS  1. Left ventricular ejection fraction, by estimation, is 55 to 60%. The left ventricle has normal function. The left ventricle has no regional wall motion abnormalities. Left ventricular diastolic parameters were normal.  2. Right ventricular systolic function is normal. The right ventricular size is normal.  3. The mitral valve is normal in structure. Trivial mitral valve regurgitation.  4. The aortic valve is tricuspid. Aortic valve regurgitation is not visualized. No aortic stenosis is present. FINDINGS  Left  Ventricle: Left ventricular ejection fraction, by estimation, is 55 to 60%. The left ventricle has normal function. The left ventricle has no regional wall motion abnormalities. The left ventricular internal cavity size was normal in size. There is  no left ventricular hypertrophy. Left ventricular diastolic parameters were normal. Normal left ventricular filling pressure. Right Ventricle: The right ventricular size is normal. No increase in right ventricular wall thickness. Right ventricular systolic function is normal. Left Atrium: Left atrial size was normal in size. Right Atrium: Right atrial size was normal in size. Pericardium: There is no evidence of pericardial effusion. Mitral Valve: The mitral valve is normal in structure. Trivial mitral valve regurgitation. Tricuspid Valve: The tricuspid valve is normal in structure. Tricuspid valve regurgitation is trivial. Aortic Valve: The aortic valve is  tricuspid. Aortic valve regurgitation is not visualized. No aortic stenosis is present. Pulmonic Valve: The pulmonic valve was normal in structure. Pulmonic valve regurgitation is not visualized. Aorta: The aortic root and ascending aorta are structurally normal, with no evidence of dilitation. IAS/Shunts: No atrial level shunt detected by color flow Doppler.  LEFT VENTRICLE PLAX 2D LVIDd:         4.70 cm  Diastology LVIDs:         2.80 cm  LV e' medial:    9.68 cm/s LV PW:         0.80 cm  LV E/e' medial:  8.9 LV IVS:        1.00 cm  LV e' lateral:   14.90 cm/s LVOT diam:     2.10 cm  LV E/e' lateral: 5.8 LV SV:         64 LV SV Index:   32 LVOT Area:     3.46 cm  RIGHT VENTRICLE             IVC RV Basal diam:  2.60 cm     IVC diam: 1.55 cm RV S prime:     17.40 cm/s TAPSE (M-mode): 2.1 cm LEFT ATRIUM             Index       RIGHT ATRIUM           Index LA diam:        3.20 cm 1.62 cm/m  RA Area:     12.30 cm LA Vol (A2C):   45.8 ml 23.18 ml/m RA Volume:   24.70 ml  12.50 ml/m LA Vol (A4C):   20.9 ml 10.58 ml/m LA Biplane Vol: 32.3 ml 16.35 ml/m  AORTIC VALVE LVOT Vmax:   95.10 cm/s LVOT Vmean:  70.200 cm/s LVOT VTI:    0.185 m  AORTA Ao Root diam: 2.80 cm Ao Asc diam:  3.50 cm MITRAL VALVE MV Area (PHT): 2.73 cm    SHUNTS MV Decel Time: 278 msec    Systemic VTI:  0.18 m MV E velocity: 85.70 cm/s  Systemic Diam: 2.10 cm MV A velocity: 96.70 cm/s MV E/A ratio:  0.89 Mihai Croitoru MD Electronically signed by Thurmon Fair MD Signature Date/Time: 05/30/2021/2:18:38 PM    Final    ECHO TEE  Result Date: 06/01/2021    TRANSESOPHOGEAL ECHO REPORT   Patient Name:   EINER MEALS Date of Exam: 06/01/2021 Medical Rec #:  [id number]      Height:       69.0 in Accession #:    [id number]     Weight:       180.8 lb Date of Birth:  May 02, 1986  BSA:          1.979 m Patient Age:    34 years       BP:           117/78 mmHg Patient Gender: M              HR:           77 bpm. Exam Location:  Inpatient Procedure:  Transesophageal Echo, 3D Echo, Cardiac Doppler and Color Doppler Indications:    Stroke  History:        Patient has prior history of Echocardiogram examinations.  Sonographer:    Leta Jungling RDCS Referring Phys: [id number] ANGELA NICOLE DUKE PROCEDURE: After discussion of the risks and benefits of a TEE, an informed consent was obtained from the patient. The transesophogeal probe was passed without difficulty through the esophogus of the patient. Imaged were obtained with the patient in a left lateral decubitus position. Sedation performed by different physician. The patient was monitored while under deep sedation. Anesthestetic sedation was provided intravenously by Anesthesiology: 358.  of Propofol. Image quality was good. The patient's vital signs; including heart rate, blood pressure, and oxygen saturation; remained stable throughout the procedure. The patient developed no complications during the procedure. IMPRESSIONS  1. There is mild thickening and fibrinous stranding noted on the atrial side of the posterior mitral valve leaflet. The fibrinous stranding appears to have independent motion from the leaflet itself. Given clinical picture, findings concerning for early  vegetation formation/infective endocarditis. Less likely degenerative changes of the mitral vale. There is mild mitral valve regurgitation.  2. Left ventricular ejection fraction, by estimation, is 60 to 65%. The left ventricle has normal function.  3. Right ventricular systolic function is normal. The right ventricular size is normal.  4. No left atrial/left atrial appendage thrombus was detected.  5. The aortic valve is tricuspid. Aortic valve regurgitation is trivial. No vegetation visualized.  6. Agitated saline contrast was given intravenously to evaluate for intracardiac shunting. A single bubble is visualized after 4 cardiac cycles on both rest and with valsalva. The bubbles appear to be coming from the pulmonary veins suggestive of an  intrapulmonary shunt over PFO. If high clinical suspicion, could consider transcranial doppler for further work-up. FINDINGS  Left Ventricle: Left ventricular ejection fraction, by estimation, is 60 to 65%. The left ventricle has normal function. The left ventricular internal cavity size was normal in size. Right Ventricle: The right ventricular size is normal. No increase in right ventricular wall thickness. Right ventricular systolic function is normal. Left Atrium: Left atrial size was normal in size. No left atrial/left atrial appendage thrombus was detected. Right Atrium: Right atrial size was normal in size. Pericardium: There is no evidence of pericardial effusion. Mitral Valve: There is mild thickening and fibrinous stranding noted on the atrial side of the posterior mitral valve leaflet. The fibrinous stranding appears to have independent motion from the leaflet itself. 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 Less likely degenerative changes of the mitral vale. The mitral valve is abnormal. Mild mitral valve regurgitation. Tricuspid Valve: The tricuspid valve is normal in structure. Tricuspid valve regurgitation is trivial. There is no evidence of tricuspid valve vegetation. Aortic Valve: The aortic valve is tricuspid. Aortic valve regurgitation is trivial. There is no evidence of aortic valve vegetation. Pulmonic Valve: The pulmonic valve was normal in structure. Pulmonic valve regurgitation is trivial. There is no evidence of pulmonic valve vegetation. Aorta: The aortic root and ascending  aorta are structurally normal, with no evidence of dilitation. IAS/Shunts: No atrial level shunt detected by color flow Doppler. Agitated saline contrast was given intravenously to evaluate for intracardiac shunting. A single bubble is visualized after 4 cardiac cycles on both rest and valsalva studies. The bubbles appear to be coming from the pulmonary veins  suggestive of an intrapulmonary shunt over PFO. If high clinical suspicion, could consider transcranial doppler for further work-up. Laurance Flatten MD Electronically signed by Laurance Flatten MD Signature Date/Time: 06/01/2021/4:44:40 PM    Final    US LIVER DOPPLER  Result Date: 05/31/2021 CLINICAL DATA:  Elevated liver enzymes and history of drug and alcohol abuse. EXAM: DUPLEX ULTRASOUND OF LIVER TECHNIQUE: Color and duplex Doppler ultrasound was performed to evaluate the hepatic in-flow and out-flow vessels. COMPARISON:  None. FINDINGS: Portal Vein Velocities Main:  27-31 cm/sec Right:  23 cm/sec Left:  19 cm/sec Normally patent portal vein with no evidence of thrombus. Direction of portal vein flow is towards the liver. Portal waveforms are within normal limits. Hepatic Vein Velocities Right:  22 cm/sec Middle:  25 cm/sec Left:  17 cm/sec Normal patent vein waveforms. No evidence of hepatic veno-occlusive disease. Hepatic Artery Velocity:  32 cm/sec Splenic Vein Velocity:  14 cm/sec Varices: None visualized. Ascites: None visualized. The spleen is normal in size. The liver parenchyma is heterogeneous with increased parenchymal echogenicity. Findings are consistent with at least steatosis. IMPRESSION: 1. No evidence of portal vein thrombosis or significant portal hypertension by hepatic duplex ultrasound. 2. Echogenic liver parenchyma consistent with at least steatosis. Electronically Signed   By: Irish Lack M.D.   On: 05/31/2021 16:39   VAS US CAROTID  Result Date: 05/31/2021 Carotid Arterial Duplex Study Patient Name:  TERRYN REDNER  Date of Exam:   05/30/2021 Medical Rec #: [id number]       Accession #:    [id number] Date of Birth: 1986-06-28      Patient Gender: M Patient Age:   34Y Exam Location:  [facility] Procedure:      VAS US CAROTID Referring Phys: [id number] [facility] --------------------------------------------------------------------------------  Indications:        CVA. Comparison Study:  No prior studies. Performing Technologist: Jean Rosenthal RDMS,RVT  Examination Guidelines: A complete evaluation includes B-mode imaging, spectral Doppler, color Doppler, and power Doppler as needed of all accessible portions of each vessel. Bilateral testing is considered an integral part of a complete examination. Limited examinations for reoccurring indications may be performed as noted.  Right Carotid Findings: +----------+--------+--------+--------+------------------+------------------+           PSV cm/sEDV cm/sStenosisPlaque DescriptionComments           +----------+--------+--------+--------+------------------+------------------+ CCA Prox  97      19                                                   +----------+--------+--------+--------+------------------+------------------+ CCA Distal80      26                                                   +----------+--------+--------+--------+------------------+------------------+ ICA Prox  52      20  intimal thickening +----------+--------+--------+--------+------------------+------------------+ ICA Distal64      37                                                   +----------+--------+--------+--------+------------------+------------------+ ECA       93      29                                                   +----------+--------+--------+--------+------------------+------------------+ +----------+--------+-------+----------------+-------------------+           PSV cm/sEDV cmsDescribe        Arm Pressure (mmHG) +----------+--------+-------+----------------+-------------------+ [id number]            Multiphasic, WNL                    +----------+--------+-------+----------------+-------------------+ +---------+--------+--+--------+--+---------+ VertebralPSV cm/s49EDV cm/s25Antegrade +---------+--------+--+--------+--+---------+  Left Carotid  Findings: +----------+--------+--------+--------+------------------+------------------+           PSV cm/sEDV cm/sStenosisPlaque DescriptionComments           +----------+--------+--------+--------+------------------+------------------+ CCA Prox  83      23                                                   +----------+--------+--------+--------+------------------+------------------+ CCA Distal84      32                                                   +----------+--------+--------+--------+------------------+------------------+ ICA Prox  37      12                                intimal thickening +----------+--------+--------+--------+------------------+------------------+ ICA Distal80      42                                                   +----------+--------+--------+--------+------------------+------------------+ ECA       101     27                                                   +----------+--------+--------+--------+------------------+------------------+ +----------+--------+--------+----------------+-------------------+           PSV cm/sEDV cm/sDescribe        Arm Pressure (mmHG) +----------+--------+--------+----------------+-------------------+ Subclavian203             Multiphasic, WNL                    +----------+--------+--------+----------------+-------------------+ +---------+--------+--+--------+--+---------+ VertebralPSV cm/s36EDV cm/s17Antegrade +---------+--------+--+--------+--+---------+   Summary: Right Carotid: The extracranial vessels were near-normal with only minimal wall  thickening or plaque. Left Carotid: The extracranial vessels were near-normal with only minimal wall               thickening or plaque. Vertebrals:  Bilateral vertebral arteries demonstrate antegrade flow. Subclavians: Normal flow hemodynamics were seen in bilateral subclavian              arteries. *See table(s) above for measurements  and observations.  Electronically signed by Delia Heady MD on 05/31/2021 at 8:06:28 AM.    Final    VAS [place] LOWER EXTREMITY VENOUS (DVT)  Result Date: 05/31/2021  Lower Venous DVT Study Patient Name:  REAKWON BARREN  Date of Exam:   05/31/2021 Medical Rec #: [id number]       Accession #:    [id number] Date of Birth: [birth year]      Patient Gender: M Patient Age:   88Y Exam Location:  [facility] Procedure:      VAS [place] LOWER EXTREMITY VENOUS (DVT) Referring Phys: [id number] [facility] --------------------------------------------------------------------------------  Indications: Stroke. Other Indications: Embolic stroke, history of current smoker, drug use and                    current alcohol use. Comparison Study: No prior. Performing Technologist: Marilynne Halsted RDMS, RVT  Examination Guidelines: A complete evaluation includes B-mode imaging, spectral Doppler, color Doppler, and power Doppler as needed of all accessible portions of each vessel. Bilateral testing is considered an integral part of a complete examination. Limited examinations for reoccurring indications may be performed as noted. The reflux portion of the exam is performed with the patient in reverse Trendelenburg.  +---------+---------------+---------+-----------+----------+--------------+ RIGHT    CompressibilityPhasicitySpontaneityPropertiesThrombus Aging +---------+---------------+---------+-----------+----------+--------------+ CFV      Full           Yes      Yes                                 +---------+---------------+---------+-----------+----------+--------------+ SFJ      Full                                                        +---------+---------------+---------+-----------+----------+--------------+ FV Prox  Full                                                        +---------+---------------+---------+-----------+----------+--------------+ FV Mid   Full                                                         +---------+---------------+---------+-----------+----------+--------------+ FV DistalFull                                                        +---------+---------------+---------+-----------+----------+--------------+ PFV      Full                                                        +---------+---------------+---------+-----------+----------+--------------+  POP      Full           Yes      Yes                                 +---------+---------------+---------+-----------+----------+--------------+ PTV      Full                                                        +---------+---------------+---------+-----------+----------+--------------+ PERO     Full                                                        +---------+---------------+---------+-----------+----------+--------------+   +---------+---------------+---------+-----------+----------+--------------+ LEFT     CompressibilityPhasicitySpontaneityPropertiesThrombus Aging +---------+---------------+---------+-----------+----------+--------------+ CFV      Full           Yes      Yes                                 +---------+---------------+---------+-----------+----------+--------------+ SFJ      Full                                                        +---------+---------------+---------+-----------+----------+--------------+ FV Prox  Full                                                        +---------+---------------+---------+-----------+----------+--------------+ FV Mid   Full                                                        +---------+---------------+---------+-----------+----------+--------------+ FV DistalFull                                                        +---------+---------------+---------+-----------+----------+--------------+ PFV      Full                                                         +---------+---------------+---------+-----------+----------+--------------+ POP      Full           Yes      Yes                                 +---------+---------------+---------+-----------+----------+--------------+  PTV      Full                                                        +---------+---------------+---------+-----------+----------+--------------+ PERO     Full                                                        +---------+---------------+---------+-----------+----------+--------------+     Summary: BILATERAL: - No evidence of deep vein thrombosis seen in the lower extremities, bilaterally. -No evidence of popliteal cyst, bilaterally.   *See table(s) above for measurements and observations. Electronically signed by Waverly Ferrari MD on 05/31/2021 at 5:56:49 PM.    Final    CT Maxillofacial Wo Contrast  Result Date: 05/29/2021 CLINICAL DATA:  Facial trauma. EXAM: CT MAXILLOFACIAL WITHOUT CONTRAST TECHNIQUE: Multidetector CT imaging of the maxillofacial structures was performed. Multiplanar CT image reconstructions were also generated. COMPARISON:  None. FINDINGS: Osseous: No fracture or mandibular dislocation. No destructive process. Orbits: Negative. No traumatic or inflammatory finding. Sinuses: Very mild anterior left ethmoid sinus mucosal thickening is seen. Soft tissues: Negative. Limited intracranial: No significant or unexpected finding. IMPRESSION: No acute osseous abnormality. Electronically Signed   By: Aram Candela M.D.   On: 05/29/2021 19:52      Subjective: Patient seen and examined the bedside this morning.  Hemodynamically stable for discharge.  I called the sister and discussed about the discharge planning  Discharge Exam: Vitals:   06/04/21 0757 06/04/21 1142  BP: 106/76 108/78  Pulse: 77 74  Resp: 17 18  Temp: 97.8 F (36.6 C) 98.1 F (36.7 C)  SpO2: 98% 98%   Vitals:   06/04/21 0014 06/04/21 0322 06/04/21 0757 06/04/21 1142   BP: 126/82 115/78 106/76 108/78  Pulse: 65 71 77 74  Resp: Temp: 98.2 F (36.8 C) 97.8 F (36.6 C) 97.8 F (36.6 C) 98.1 F (36.7 C)  TempSrc: Oral Oral Oral Oral  SpO2: 97% 94% 98% 98%  Weight:  82 kg    Height:        General: Pt is alert, awake, not in acute distress Cardiovascular: RRR, S1/S2 +, no rubs, no gallops Respiratory: CTA bilaterally, no wheezing, no rhonchi Abdominal: Soft, NT, ND, bowel sounds + Extremities: no edema, no cyanosis    The results of significant diagnostics from this hospitalization (including imaging, microbiology, ancillary and laboratory) are listed below for reference.     Microbiology: Recent Results (from the past 240 hour(s))  Culture, blood (routine x 2)     Status: None (Preliminary result)   Collection Time: 05/29/21  6:35 PM   Specimen: Left Antecubital; Blood  Result Value Ref Range Status   Specimen Description   Final    LEFT ANTECUBITAL BLOOD Performed at [facility], [address] [place], [place] [address]    Special Requests   Final    BOTTLES DRAWN AEROBIC AND ANAEROBIC Blood Culture adequate volume Performed at [facility], [address] [place], [place] [address]    Culture   Final    NO GROWTH 3 DAYS Performed at [facility], [address], [place], [place] [address]    Report Status PENDING  Incomplete  Culture, blood (routine x 2)     Status: None (Preliminary result)   Collection Time: 05/29/21  6:35 PM   Specimen: BLOOD RIGHT HAND  Result Value Ref Range Status   Specimen Description   Final    BLOOD RIGHT HAND Performed at [facility], [address], [place], [place] [address]    Special Requests   Final    BOTTLES DRAWN AEROBIC AND ANAEROBIC Blood Culture results may not be optimal due to an excessive volume of blood received in culture bottles Performed at [facility], [address] [place], [place] [address]    Culture   Final     NO GROWTH 3 DAYS Performed at [facility], [address], [place], [place] [address]    Report Status PENDING  Incomplete  Resp Panel by RT-PCR (Flu A&B, Covid) Nasopharyngeal Swab     Status: None   Collection Time: 05/29/21  8:32 PM   Specimen: Nasopharyngeal Swab; Nasopharyngeal(NP) swabs in vial transport medium  Result Value Ref Range Status   SARS Coronavirus 2 by RT PCR NEGATIVE NEGATIVE Final    Comment: (NOTE) SARS-CoV-2 target nucleic acids are NOT DETECTED.  The SARS-CoV-2 RNA is generally detectable in upper respiratory specimens during the acute phase of infection. The lowest concentration of SARS-CoV-2 viral copies this assay can detect is 138 copies/mL. A negative result does not preclude SARS-Cov-2 infection and should not be used as the sole basis for treatment or other patient management decisions. A negative result may occur with  improper specimen collection/handling, submission of specimen other than nasopharyngeal swab, presence of viral mutation(s) within the areas targeted by this assay, and inadequate number of viral copies(<138 copies/mL). A negative result must be combined with clinical observations, patient history, and epidemiological information. The expected result is Negative.  Fact Sheet for Patients:  BloggerCourse.com  Fact Sheet for Healthcare Providers:  SeriousBroker.it  This test is no t yet approved or cleared by the [place] FDA and  has been authorized for detection and/or diagnosis of SARS-CoV-2 by FDA under an Emergency Use Authorization (EUA). This EUA will remain  in effect (meaning this test can be used) for the duration of the COVID-19 declaration under Section 564(b)(1) of the Act, 21 U.S.C.section 360bbb-3(b)(1), unless the authorization is terminated  or revoked sooner.       Influenza A by PCR NEGATIVE NEGATIVE Final   Influenza B by PCR NEGATIVE NEGATIVE Final     Comment: (NOTE) The Xpert Xpress SARS-CoV-2/FLU/RSV plus assay is intended as an aid in the diagnosis of influenza from Nasopharyngeal swab specimens and should not be used as a sole basis for treatment. Nasal washings and aspirates are unacceptable for Xpert Xpress SARS-CoV-2/FLU/RSV testing.  Fact Sheet for Patients: BloggerCourse.com  Fact Sheet for Healthcare Providers: SeriousBroker.it  This test is not yet approved or cleared by the [place] FDA and has been authorized for detection and/or diagnosis of SARS-CoV-2 by FDA under an Emergency Use Authorization (EUA). This EUA will remain in effect (meaning this test can be used) for the duration of the COVID-19 declaration under Section 564(b)(1) of the Act, 21 U.S.C. section 360bbb-3(b)(1), unless the authorization is terminated or revoked.  Performed at [facility], [address] [place], [place] [address]   CSF culture w Stat Gram Stain     Status: None  Collection Time: 05/30/21  7:41 AM   Specimen: CSF; Cerebrospinal Fluid  Result Value Ref Range Status   Specimen Description CSF  Final   Special Requests NONE  Final   Gram Stain   Final    WBC PRESENT, PREDOMINANTLY MONONUCLEAR NO ORGANISMS SEEN CYTOSPIN SMEAR    Culture   Final    NO GROWTH Performed at [facility], [address] [place], [place] [address]    Report Status 06/02/2021 FINAL  Final  Culture, blood (routine x 2)     Status: None (Preliminary result)   Collection Time: 06/01/21  4:31 PM   Specimen: BLOOD  Result Value Ref Range Status   Specimen Description BLOOD LEFT ANTECUBITAL  Final   Special Requests   Final    BOTTLES DRAWN AEROBIC ONLY Blood Culture adequate volume   Culture   Final    NO GROWTH < 24 HOURS Performed at [facility], [address] [place], [place] [address]    Report Status PENDING  Incomplete  Culture, blood (routine x 2)      Status: None (Preliminary result)   Collection Time: 06/01/21  4:31 PM   Specimen: BLOOD RIGHT HAND  Result Value Ref Range Status   Specimen Description BLOOD RIGHT HAND  Final   Special Requests   Final    BOTTLES DRAWN AEROBIC ONLY Blood Culture adequate volume   Culture   Final    NO GROWTH < 24 HOURS Performed at [facility], [address], [place], [place] [address]    Report Status PENDING  Incomplete     Labs: BNP (last 3 results) No results for input(s): BNP in the last 8760 hours. Basic Metabolic Panel: Recent Labs  Lab 05/31/21 0105 06/01/21 0259 06/02/21 0307 06/03/21 0336 06/04/21 0253  NA 136 137 134* 135 137  K 4.1 4.2 3.6 3.9 3.8  CL 102 103 102 105 106  CO2 28 28 23 23 24   GLUCOSE 141* 113* 122* 112* 113*  BUN 13 12 14 14 15   CREATININE 1.00 1.09 1.03 0.97 1.12  CALCIUM 7.8* 8.6* 8.6* 8.9 8.4*   Liver Function Tests: Recent Labs  Lab 05/31/21 0105 06/01/21 0259 06/02/21 0307 06/03/21 0336 06/04/21 0253  AST 986* 1,120* 862* 565* 322*  ALT 600* 706* 678* 605* 497*  ALKPHOS 42 44 40 38 38  BILITOT 0.9 0.9 0.4 1.0 0.8  PROT 5.1* 5.6* 5.5* 5.5* 5.2*  ALBUMIN 2.7* 3.0* 2.9* 2.9* 2.8*   Recent Labs  Lab 05/29/21 1835  LIPASE 27   Recent Labs  Lab 05/29/21 1849  AMMONIA 30   CBC: Recent Labs  Lab 05/29/21 1835 05/29/21 1901 05/29/21 1902 05/30/21 0306 05/31/21 0105  WBC 9.4  --   --  8.7 10.0  NEUTROABS 7.9*  --   --  7.7 7.9*  HGB 15.4 18.4* 18.7* 15.2 14.1  HCT 43.9 54.0* 55.0* 43.8 39.8  MCV 85.4  --   --  87.4 85.4  PLT 225  --   --  187 209   Cardiac Enzymes: Recent Labs  Lab 06/01/21 1141 06/03/21 0336 06/04/21 0253  CKTOTAL 32,193* 7,268* 3,993*   BNP: Invalid input(s): POCBNP CBG: Recent Labs  Lab 06/03/21 1209 06/03/21 1741 06/04/21 0012 06/04/21 0534 06/04/21 1143  GLUCAP 153* 109* 115* 103* 114*   D-Dimer No results for input(s): DDIMER in the last 72 hours. Hgb A1c No results for input(s):  HGBA1C in the last 72 hours. Lipid  Profile No results for input(s): CHOL, HDL, LDLCALC, TRIG, CHOLHDL, LDLDIRECT in the last 72 hours. Thyroid function studies No results for input(s): TSH, T4TOTAL, T3FREE, THYROIDAB in the last 72 hours.  Invalid input(s): FREET3 Anemia work up No results for input(s): VITAMINB12, FOLATE, FERRITIN, TIBC, IRON, RETICCTPCT in the last 72 hours. Urinalysis    Component Value Date/Time   COLORURINE YELLOW 05/29/2021 1936   APPEARANCEUR CLEAR 05/29/2021 1936   LABSPEC 1.020 05/29/2021 1936   PHURINE 6.0 05/29/2021 1936   GLUCOSEU NEGATIVE 05/29/2021 1936   HGBUR MODERATE (A) 05/29/2021 1936   BILIRUBINUR SMALL (A) 05/29/2021 1936   KETONESUR NEGATIVE 05/29/2021 1936   PROTEINUR 100 (A) 05/29/2021 1936   NITRITE POSITIVE (A) 05/29/2021 1936   LEUKOCYTESUR NEGATIVE 05/29/2021 1936   Sepsis Labs Invalid input(s): PROCALCITONIN,  WBC,  LACTICIDVEN Microbiology Recent Results (from the past 240 hour(s))  Culture, blood (routine x 2)     Status: None (Preliminary result)   Collection Time: 05/29/21  6:35 PM   Specimen: Left Antecubital; Blood  Result Value Ref Range Status   Specimen Description   Final    LEFT ANTECUBITAL BLOOD Performed at [facility], [address] [place], [place] [address]    Special Requests   Final    BOTTLES DRAWN AEROBIC AND ANAEROBIC Blood Culture adequate volume Performed at [facility], [address] [place], [place] [address]    Culture   Final    NO GROWTH 3 DAYS Performed at [facility], [address] [place], [place] [address]    Report Status PENDING  Incomplete  Culture, blood (routine x 2)     Status: None (Preliminary result)   Collection Time: 05/29/21  6:35 PM   Specimen: BLOOD RIGHT HAND  Result Value Ref Range Status   Specimen Description   Final    BLOOD RIGHT HAND Performed at [facility], [address], [place], [place] [address]    Special Requests    Final    BOTTLES DRAWN AEROBIC AND ANAEROBIC Blood Culture results may not be optimal due to an excessive volume of blood received in culture bottles Performed at [facility], [address] [place], [place] [address]    Culture   Final    NO GROWTH 3 DAYS Performed at [facility], [address] [place], [place] [address]    Report Status PENDING  Incomplete  Resp Panel by RT-PCR (Flu A&B, Covid) Nasopharyngeal Swab     Status: None   Collection Time: 05/29/21  8:32 PM   Specimen: Nasopharyngeal Swab; Nasopharyngeal(NP) swabs in vial transport medium  Result Value Ref Range Status   SARS Coronavirus 2 by RT PCR NEGATIVE NEGATIVE Final    Comment: (NOTE) SARS-CoV-2 target nucleic acids are NOT DETECTED.  The SARS-CoV-2 RNA is generally detectable in upper respiratory specimens during the acute phase of infection. The lowest concentration of SARS-CoV-2 viral copies this assay can detect is 138 copies/mL. A negative result does not preclude SARS-Cov-2 infection and should not be used as the sole basis for treatment or other patient management decisions. A negative result may occur with  improper specimen collection/handling, submission of specimen other than nasopharyngeal swab, presence of viral mutation(s) within the areas targeted by this assay, and inadequate number of viral copies(<138 copies/mL). A negative result must be combined with clinical observations, patient history, and epidemiological information. The expected result is Negative.  Fact Sheet  for Patients:  BloggerCourse.comhttps://www.fda.gov/media/152166/download  Fact Sheet for Healthcare Providers:  SeriousBroker.ithttps://www.fda.gov/media/152162/download  This test is no t yet approved or cleared by the [place] FDA and  has been authorized for detection and/or diagnosis of SARS-CoV-2 by FDA under an Emergency Use Authorization (EUA). This EUA will remain  in effect (meaning this test can be used) for the duration  of the COVID-19 declaration under Section 564(b)(1) of the Act, 21 U.S.C.section 360bbb-3(b)(1), unless the authorization is terminated  or revoked sooner.       Influenza A by PCR NEGATIVE NEGATIVE Final   Influenza B by PCR NEGATIVE NEGATIVE Final    Comment: (NOTE) The Xpert Xpress SARS-CoV-2/FLU/RSV plus assay is intended as an aid in the diagnosis of influenza from Nasopharyngeal swab specimens and should not be used as a sole basis for treatment. Nasal washings and aspirates are unacceptable for Xpert Xpress SARS-CoV-2/FLU/RSV testing.  Fact Sheet for Patients: BloggerCourse.comhttps://www.fda.gov/media/152166/download  Fact Sheet for Healthcare Providers: SeriousBroker.ithttps://www.fda.gov/media/152162/download  This test is not yet approved or cleared by the [place] FDA and has been authorized for detection and/or diagnosis of SARS-CoV-2 by FDA under an Emergency Use Authorization (EUA). This EUA will remain in effect (meaning this test can be used) for the duration of the COVID-19 declaration under Section 564(b)(1) of the Act, 21 U.S.C. section 360bbb-3(b)(1), unless the authorization is terminated or revoked.  Performed at [facility], [address] [place], [place] [address]   CSF culture w Stat Gram Stain     Status: None   Collection Time: 05/30/21  7:41 AM   Specimen: CSF; Cerebrospinal Fluid  Result Value Ref Range Status   Specimen Description CSF  Final   Special Requests NONE  Final   Gram Stain   Final    WBC PRESENT, PREDOMINANTLY MONONUCLEAR NO ORGANISMS SEEN CYTOSPIN SMEAR    Culture   Final    NO GROWTH Performed at [facility], [address] [place], [place] [address]    Report Status 06/02/2021 FINAL  Final  Culture, blood (routine x 2)     Status: None (Preliminary result)   Collection Time: 06/01/21  4:31 PM   Specimen: BLOOD  Result Value Ref Range Status   Specimen Description BLOOD LEFT ANTECUBITAL  Final   Special Requests   Final     BOTTLES DRAWN AEROBIC ONLY Blood Culture adequate volume   Culture   Final    NO GROWTH < 24 HOURS Performed at [facility], [address] [place], [place] [address]    Report Status PENDING  Incomplete  Culture, blood (routine x 2)     Status: None (Preliminary result)   Collection Time: 06/01/21  4:31 PM   Specimen: BLOOD RIGHT HAND  Result Value Ref Range Status   Specimen Description BLOOD RIGHT HAND  Final   Special Requests   Final    BOTTLES DRAWN AEROBIC ONLY Blood Culture adequate volume   Culture   Final    NO GROWTH < 24 HOURS Performed at [facility], [address] [place], [place] [address]    Report Status PENDING  Incomplete    Please note: You were cared for by a hospitalist during your hospital stay. Once you are discharged, your primary care physician will handle any further medical issues. Please note that NO REFILLS for any discharge medications will be authorized once you are discharged, as it is imperative that you return to your primary care physician (  or establish a relationship with a primary care physician if you do not have one) for your post hospital discharge needs so that they can reassess your need for medications and monitor your lab values.    Time coordinating discharge: 40 minutes  SIGNED:   Burnadette Pop, MD  [facility] 06/04/2021, 1:57 PM Pager [phone]  If 7PM-7AM, please contact night-coverage [email or web address] Password [email or web address]

## 2021-06-04 NOTE — Plan of Care (Signed)
  Problem: Education: Goal: Knowledge of General Education information will improve Description: Including pain rating scale, medication(s)/side effects and non-pharmacologic comfort measures Outcome: Adequate for Discharge   Problem: Health Behavior/Discharge Planning: Goal: Ability to manage health-related needs will improve Outcome: Adequate for Discharge   Problem: Clinical Measurements: Goal: Ability to maintain clinical measurements within normal limits will improve Outcome: Adequate for Discharge Goal: Will remain free from infection Outcome: Adequate for Discharge Goal: Diagnostic test results will improve Outcome: Adequate for Discharge Goal: Respiratory complications will improve Outcome: Adequate for Discharge Goal: Cardiovascular complication will be avoided Outcome: Adequate for Discharge   Problem: Activity: Goal: Risk for activity intolerance will decrease Outcome: Adequate for Discharge   Problem: Nutrition: Goal: Adequate nutrition will be maintained Outcome: Adequate for Discharge   Problem: Coping: Goal: Level of anxiety will decrease Outcome: Adequate for Discharge   Problem: Pain Managment: Goal: General experience of comfort will improve Outcome: Adequate for Discharge   Problem: Safety: Goal: Ability to remain free from injury will improve Outcome: Adequate for Discharge   Problem: Skin Integrity: Goal: Risk for impaired skin integrity will decrease Outcome: Adequate for Discharge   Problem: Education: Goal: Knowledge of disease or condition will improve Outcome: Adequate for Discharge Goal: Knowledge of secondary prevention will improve Outcome: Adequate for Discharge Goal: Knowledge of patient specific risk factors addressed and post discharge goals established will improve Outcome: Adequate for Discharge Goal: Individualized Educational Video(s) Outcome: Adequate for Discharge   Problem: Coping: Goal: Will verbalize positive feelings  about self Outcome: Adequate for Discharge   Problem: Health Behavior/Discharge Planning: Goal: Ability to manage health-related needs will improve Outcome: Adequate for Discharge   Problem: Nutrition: Goal: Risk of aspiration will decrease Outcome: Adequate for Discharge   Problem: Intracerebral Hemorrhage Tissue Perfusion: Goal: Complications of Intracerebral Hemorrhage will be minimized Outcome: Adequate for Discharge

## 2021-06-04 NOTE — Progress Notes (Signed)
Pt is eager for discharge orders and continues to rome out into the halls, asking reputedly. RN has reached out to provider for details. Will continue to monitor.

## 2021-06-06 ENCOUNTER — Other Ambulatory Visit (HOSPITAL_COMMUNITY): Payer: Self-pay

## 2021-06-06 LAB — CULTURE, BLOOD (ROUTINE X 2)
Culture: NO GROWTH
Culture: NO GROWTH
Special Requests: ADEQUATE
Special Requests: ADEQUATE

## 2021-06-08 LAB — ANTINUCLEAR ANTIBODIES, IFA: ANA Ab, IFA: NEGATIVE

## 2021-06-09 ENCOUNTER — Other Ambulatory Visit (HOSPITAL_COMMUNITY): Payer: Self-pay

## 2021-06-28 ENCOUNTER — Ambulatory Visit: Payer: Self-pay | Admitting: Infectious Disease

## 2021-06-29 ENCOUNTER — Ambulatory Visit: Payer: Self-pay | Admitting: Infectious Disease

## 2021-06-30 ENCOUNTER — Encounter: Payer: Self-pay | Admitting: Infectious Disease

## 2021-06-30 ENCOUNTER — Ambulatory Visit: Payer: BLUE CROSS/BLUE SHIELD | Admitting: Infectious Disease

## 2021-06-30 ENCOUNTER — Other Ambulatory Visit: Payer: Self-pay

## 2021-06-30 VITALS — BP 119/77 | HR 75 | Temp 97.6°F | Wt 169.0 lb

## 2021-06-30 DIAGNOSIS — K700 Alcoholic fatty liver: Secondary | ICD-10-CM

## 2021-06-30 DIAGNOSIS — I059 Rheumatic mitral valve disease, unspecified: Secondary | ICD-10-CM | POA: Diagnosis not present

## 2021-06-30 DIAGNOSIS — I6389 Other cerebral infarction: Secondary | ICD-10-CM

## 2021-06-30 DIAGNOSIS — I619 Nontraumatic intracerebral hemorrhage, unspecified: Secondary | ICD-10-CM | POA: Diagnosis not present

## 2021-06-30 DIAGNOSIS — F1910 Other psychoactive substance abuse, uncomplicated: Secondary | ICD-10-CM | POA: Diagnosis not present

## 2021-06-30 DIAGNOSIS — I6330 Cerebral infarction due to thrombosis of unspecified cerebral artery: Secondary | ICD-10-CM

## 2021-06-30 DIAGNOSIS — M6282 Rhabdomyolysis: Secondary | ICD-10-CM

## 2021-06-30 DIAGNOSIS — R413 Other amnesia: Secondary | ICD-10-CM

## 2021-06-30 DIAGNOSIS — T796XXD Traumatic ischemia of muscle, subsequent encounter: Secondary | ICD-10-CM

## 2021-06-30 DIAGNOSIS — K7010 Alcoholic hepatitis without ascites: Secondary | ICD-10-CM

## 2021-06-30 HISTORY — DX: Alcoholic hepatitis without ascites: K70.10

## 2021-06-30 HISTORY — DX: Rhabdomyolysis: M62.82

## 2021-06-30 HISTORY — DX: Other amnesia: R41.3

## 2021-06-30 NOTE — Patient Instructions (Signed)
I recommend asking your Primary Care Physician to check blood cultures 14+ days after you finish your antibiotics
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
